# Patient Record
Sex: Female | Born: 1940 | Race: Black or African American | Hispanic: No | Smoking: Current every day smoker
Health system: Southern US, Community
[De-identification: ages and names within clinical notes are randomized; demographics above are authoritative.]

## PROBLEM LIST (undated history)

## (undated) DIAGNOSIS — K219 Gastro-esophageal reflux disease without esophagitis: Secondary | ICD-10-CM

## (undated) DIAGNOSIS — G459 Transient cerebral ischemic attack, unspecified: Secondary | ICD-10-CM

## (undated) DIAGNOSIS — I509 Heart failure, unspecified: Secondary | ICD-10-CM

## (undated) DIAGNOSIS — M1990 Unspecified osteoarthritis, unspecified site: Secondary | ICD-10-CM

## (undated) DIAGNOSIS — I10 Essential (primary) hypertension: Secondary | ICD-10-CM

## (undated) DIAGNOSIS — N289 Disorder of kidney and ureter, unspecified: Secondary | ICD-10-CM

## (undated) HISTORY — PX: ABDOMINAL HYSTERECTOMY: SHX81

---

## 2008-04-18 ENCOUNTER — Ambulatory Visit (HOSPITAL_BASED_OUTPATIENT_CLINIC_OR_DEPARTMENT_OTHER): Admission: RE | Admit: 2008-04-18 | Discharge: 2008-04-18 | Payer: Self-pay | Admitting: Internal Medicine

## 2008-04-18 ENCOUNTER — Ambulatory Visit: Payer: Self-pay | Admitting: Diagnostic Radiology

## 2014-02-24 ENCOUNTER — Encounter (HOSPITAL_BASED_OUTPATIENT_CLINIC_OR_DEPARTMENT_OTHER): Payer: Self-pay | Admitting: *Deleted

## 2014-02-24 ENCOUNTER — Emergency Department (HOSPITAL_BASED_OUTPATIENT_CLINIC_OR_DEPARTMENT_OTHER)
Admission: EM | Admit: 2014-02-24 | Discharge: 2014-02-24 | Disposition: A | Discharge status: Home / Self Care | Payer: Medicare Other | Attending: Emergency Medicine | Admitting: Emergency Medicine

## 2014-02-24 ENCOUNTER — Emergency Department (HOSPITAL_BASED_OUTPATIENT_CLINIC_OR_DEPARTMENT_OTHER): Payer: Medicare Other

## 2014-02-24 DIAGNOSIS — K219 Gastro-esophageal reflux disease without esophagitis: Secondary | ICD-10-CM | POA: Insufficient documentation

## 2014-02-24 DIAGNOSIS — R531 Weakness: Secondary | ICD-10-CM | POA: Diagnosis present

## 2014-02-24 DIAGNOSIS — M1990 Unspecified osteoarthritis, unspecified site: Secondary | ICD-10-CM | POA: Diagnosis not present

## 2014-02-24 DIAGNOSIS — Z79899 Other long term (current) drug therapy: Secondary | ICD-10-CM | POA: Diagnosis not present

## 2014-02-24 DIAGNOSIS — I509 Heart failure, unspecified: Secondary | ICD-10-CM | POA: Insufficient documentation

## 2014-02-24 DIAGNOSIS — R42 Dizziness and giddiness: Secondary | ICD-10-CM | POA: Diagnosis not present

## 2014-02-24 DIAGNOSIS — I10 Essential (primary) hypertension: Secondary | ICD-10-CM | POA: Diagnosis not present

## 2014-02-24 HISTORY — DX: Gastro-esophageal reflux disease without esophagitis: K21.9

## 2014-02-24 HISTORY — DX: Essential (primary) hypertension: I10

## 2014-02-24 HISTORY — DX: Heart failure, unspecified: I50.9

## 2014-02-24 HISTORY — DX: Unspecified osteoarthritis, unspecified site: M19.90

## 2014-02-24 LAB — CBC WITH DIFFERENTIAL/PLATELET
BASOS PCT: 1 % (ref 0–1)
Basophils Absolute: 0.1 10*3/uL (ref 0.0–0.1)
EOS ABS: 0.4 10*3/uL (ref 0.0–0.7)
Eosinophils Relative: 6 % — ABNORMAL HIGH (ref 0–5)
HCT: 35.6 % — ABNORMAL LOW (ref 36.0–46.0)
Hemoglobin: 11.5 g/dL — ABNORMAL LOW (ref 12.0–15.0)
LYMPHS ABS: 2 10*3/uL (ref 0.7–4.0)
Lymphocytes Relative: 27 % (ref 12–46)
MCH: 28.8 pg (ref 26.0–34.0)
MCHC: 32.3 g/dL (ref 30.0–36.0)
MCV: 89.2 fL (ref 78.0–100.0)
Monocytes Absolute: 1 10*3/uL (ref 0.1–1.0)
Monocytes Relative: 13 % — ABNORMAL HIGH (ref 3–12)
NEUTROS PCT: 53 % (ref 43–77)
Neutro Abs: 4.1 10*3/uL (ref 1.7–7.7)
PLATELETS: 264 10*3/uL (ref 150–400)
RBC: 3.99 MIL/uL (ref 3.87–5.11)
RDW: 13.1 % (ref 11.5–15.5)
WBC: 7.6 10*3/uL (ref 4.0–10.5)

## 2014-02-24 LAB — COMPREHENSIVE METABOLIC PANEL
ALT: 13 U/L (ref 0–35)
ANION GAP: 7 (ref 5–15)
AST: 18 U/L (ref 0–37)
Albumin: 3.7 g/dL (ref 3.5–5.2)
Alkaline Phosphatase: 126 U/L — ABNORMAL HIGH (ref 39–117)
BILIRUBIN TOTAL: 0.2 mg/dL — AB (ref 0.3–1.2)
BUN: 27 mg/dL — AB (ref 6–23)
CALCIUM: 9.4 mg/dL (ref 8.4–10.5)
CO2: 26 mmol/L (ref 19–32)
CREATININE: 1.36 mg/dL — AB (ref 0.50–1.10)
Chloride: 105 mEq/L (ref 96–112)
GFR, EST AFRICAN AMERICAN: 43 mL/min — AB (ref >90)
GFR, EST NON AFRICAN AMERICAN: 37 mL/min — AB (ref >90)
Glucose, Bld: 102 mg/dL — ABNORMAL HIGH (ref 70–99)
POTASSIUM: 4.5 mmol/L (ref 3.5–5.1)
SODIUM: 138 mmol/L (ref 135–145)
Total Protein: 7.2 g/dL (ref 6.0–8.3)

## 2014-02-24 LAB — BRAIN NATRIURETIC PEPTIDE: B NATRIURETIC PEPTIDE 5: 53.2 pg/mL (ref 0.0–100.0)

## 2014-02-24 LAB — URINALYSIS, ROUTINE W REFLEX MICROSCOPIC
BILIRUBIN URINE: NEGATIVE
Glucose, UA: NEGATIVE mg/dL
HGB URINE DIPSTICK: NEGATIVE
KETONES UR: NEGATIVE mg/dL
Leukocytes, UA: NEGATIVE
NITRITE: NEGATIVE
Protein, ur: NEGATIVE mg/dL
SPECIFIC GRAVITY, URINE: 1.019 (ref 1.005–1.030)
UROBILINOGEN UA: 1 mg/dL (ref 0.0–1.0)
pH: 7 (ref 5.0–8.0)

## 2014-02-24 LAB — TROPONIN I

## 2014-02-24 MED ORDER — SODIUM CHLORIDE 0.9 % IV BOLUS (SEPSIS): 500.0000 mL | Freq: Once | INTRAVENOUS | Status: DC

## 2014-02-24 NOTE — ED Provider Notes (Signed)
CSN: [id number]     Arrival date & time 02/24/14  1239 History   First MD Initiated Contact with Patient 02/24/14 1258     Chief Complaint  Patient presents with  . Weakness     (Consider location/radiation/quality/duration/timing/severity/associated sxs/prior Treatment) The history is provided by the patient.  Angela Chuauline S Bundrick is a 73 y.o. female hx of CHF, HTN, GERD here with diffuse weakness, lightheadedness. She felt lightheaded dizzy today. Like she was going to pass out. She has chronic shortness of breath from her CHF and is only on Cozaar and spironolactone. She notes that over the last 3 days she came in about 8 pounds. She states that leg swelling has been stable. Denies any fevers or chills and has been eating well. Her hands occasionally goes numb but denies any weakness or any trouble speaking. Denies any history of strokes. Denies any chest pain.    Past Medical History  Diagnosis Date  . CHF (congestive heart failure)   . Hypertension   . Arthritis   . GERD (gastroesophageal reflux disease)    History reviewed. No pertinent past surgical history. No family history on file. History  Substance Use Topics  . Smoking status: Never Smoker   . Smokeless tobacco: Not on file  . Alcohol Use: Not on file   OB History    No data available     Review of Systems  Neurological: Positive for dizziness.  All other systems reviewed and are negative.     Allergies  Aspirin and Lisinopril  Home Medications   Prior to Admission medications   Medication Sig Start Date End Date Taking? Authorizing Provider  calcium-vitamin D (OSCAL WITH D) 500-200 MG-UNIT per tablet Take 1 tablet by mouth.   Yes Historical Provider, MD  carvedilol (COREG) 25 MG tablet Take 25 mg by mouth 2 (two) times daily with a meal.   Yes Historical Provider, MD  losartan (COZAAR) 100 MG tablet Take 100 mg by mouth daily.   Yes Historical Provider, MD  Multiple Vitamins-Minerals (MULTIVITAMIN WITH  MINERALS) tablet Take 1 tablet by mouth daily.   Yes Historical Provider, MD  omeprazole (PRILOSEC) 20 MG capsule Take 20 mg by mouth daily.   Yes Historical Provider, MD  spironolactone (ALDACTONE) 25 MG tablet Take 25 mg by mouth daily.   Yes Historical Provider, MD  traMADol (ULTRAM) 50 MG tablet Take by mouth every 6 (six) hours as needed.   Yes Historical Provider, MD   BP 116/66 mmHg  Pulse 57  Temp(Src) 98.2 F (36.8 C) (Oral)  Resp 24  Ht 5\' 3"  (1.6 m)  Wt 201 lb (91.173 kg)  BMI 35.61 kg/m2  SpO2 96% Physical Exam  Constitutional:  Chronically ill, but well appearing   HENT:  Head: Normocephalic and atraumatic.  Mouth/Throat: Oropharynx is clear and moist.  Eyes: Conjunctivae and EOM are normal. Pupils are equal, round, and reactive to light.  No nystagmus   Neck: Normal range of motion. Neck supple.  Cardiovascular: Normal rate, regular rhythm and normal heart sounds.   Pulmonary/Chest: Effort normal.  Mild bibasilar crackles.   Abdominal: Soft. Bowel sounds are normal. She exhibits no distension. There is no tenderness. There is no rebound.  Musculoskeletal: Normal range of motion.  1+ edema bilaterally (chronic)   Neurological: She is alert.  CN 2-12 intact. Nl strength and sensation throughout.   Skin: Skin is warm and dry.  Psychiatric: She has a normal mood and affect. Her behavior is normal. Judgment and  thought content normal.  Nursing note and vitals reviewed.   ED Course  Procedures (including critical care time) Labs Review Labs Reviewed  CBC WITH DIFFERENTIAL - Abnormal; Notable for the following:    Hemoglobin 11.5 (*)    HCT 35.6 (*)    Monocytes Relative 13 (*)    Eosinophils Relative 6 (*)    All other components within normal limits  COMPREHENSIVE METABOLIC PANEL - Abnormal; Notable for the following:    Glucose, Bld 102 (*)    BUN 27 (*)    Creatinine, Ser 1.36 (*)    Alkaline Phosphatase 126 (*)    Total Bilirubin 0.2 (*)    GFR calc non  Af Amer 37 (*)    GFR calc Af Amer 43 (*)    All other components within normal limits  URINALYSIS, ROUTINE W REFLEX MICROSCOPIC - Abnormal; Notable for the following:    APPearance CLOUDY (*)    All other components within normal limits  TROPONIN I  BRAIN NATRIURETIC PEPTIDE    Imaging Review Dg Chest 2 View  02/24/2014   CLINICAL DATA:  Weakness for 3-4 days.  EXAM: CHEST  2 VIEW  COMPARISON:  CT chest 04/18/2008.  FINDINGS: The lungs are emphysematous but clear. Heart size is upper normal. No pneumothorax or pleural effusion. Marked convex left thoracolumbar scoliosis is noted.  IMPRESSION: Emphysema without acute disease.   Electronically Signed   By: Drusilla Kannerhomas  Dalessio M.D.   On: 02/24/2014 14:02     EKG Interpretation   Date/Time:  Friday February 24 2014 13:07:13 EST Ventricular Rate:  54 PR Interval:  168 QRS Duration: 78 QT Interval:  420 QTC Calculation: 398 R Axis:   -10 Text Interpretation:  Sinus bradycardia Low voltage QRS Borderline ECG No  previous ECGs available Confirmed by Limmie Schoenberg  MD, Ashani Pumphrey ([id number]) on 02/24/2014  2:52:02 PM      MDM   Final diagnoses:  Weakness    Angela Mueller is a 73 y.o. female here with lightheadedness, weight gain, SOB. Consider CHF exacerbation vs infection. Less likely to be ACS. Will get labs, BNP, CXR, UA.   3:13 PM CXR unremarkable. Labs unremarkable. BNP nl. UA nl. Not orthostatic. I don't know why she gained weight or why she was dizzy. She now says that she missed a meal today. Likely mild dehydration. Stable for d/c.     Richardean Canalavid H Josiane Labine, MD 02/24/14 [phone]

## 2014-02-24 NOTE — ED Notes (Signed)
MD at bedside. 

## 2014-02-24 NOTE — ED Notes (Signed)
C/o weakness and hx of chf and states she has gained 8 lbs over last 3 days. Hands going numb on and off. C/o dizziness.

## 2014-02-24 NOTE — Discharge Instructions (Signed)
Continue taking your medicines.   Follow up with your doctor.   Return to ER if you have worse shortness of breath, passing out, dizziness, weakness.

## 2020-04-03 ENCOUNTER — Emergency Department (HOSPITAL_BASED_OUTPATIENT_CLINIC_OR_DEPARTMENT_OTHER)
Admission: EM | Admit: 2020-04-03 | Discharge: 2020-04-03 | Disposition: A | Discharge status: Home / Self Care | Payer: Medicare Other | Attending: Emergency Medicine | Admitting: Emergency Medicine

## 2020-04-03 ENCOUNTER — Encounter (HOSPITAL_BASED_OUTPATIENT_CLINIC_OR_DEPARTMENT_OTHER): Payer: Self-pay | Admitting: *Deleted

## 2020-04-03 ENCOUNTER — Other Ambulatory Visit: Payer: Self-pay

## 2020-04-03 DIAGNOSIS — Z79899 Other long term (current) drug therapy: Secondary | ICD-10-CM | POA: Insufficient documentation

## 2020-04-03 DIAGNOSIS — R42 Dizziness and giddiness: Secondary | ICD-10-CM | POA: Diagnosis present

## 2020-04-03 DIAGNOSIS — R251 Tremor, unspecified: Secondary | ICD-10-CM | POA: Diagnosis not present

## 2020-04-03 DIAGNOSIS — I509 Heart failure, unspecified: Secondary | ICD-10-CM | POA: Insufficient documentation

## 2020-04-03 DIAGNOSIS — I110 Hypertensive heart disease with heart failure: Secondary | ICD-10-CM | POA: Diagnosis not present

## 2020-04-03 HISTORY — DX: Transient cerebral ischemic attack, unspecified: G45.9

## 2020-04-03 HISTORY — DX: Disorder of kidney and ureter, unspecified: N28.9

## 2020-04-03 LAB — CBC
HCT: 39 % (ref 36.0–46.0)
Hemoglobin: 12.6 g/dL (ref 12.0–15.0)
MCH: 27.5 pg (ref 26.0–34.0)
MCHC: 32.3 g/dL (ref 30.0–36.0)
MCV: 85 fL (ref 80.0–100.0)
Platelets: 265 10*3/uL (ref 150–400)
RBC: 4.59 MIL/uL (ref 3.87–5.11)
RDW: 14.7 % (ref 11.5–15.5)
WBC: 6.9 10*3/uL (ref 4.0–10.5)
nRBC: 0 % (ref 0.0–0.2)

## 2020-04-03 LAB — URINALYSIS, ROUTINE W REFLEX MICROSCOPIC
Bilirubin Urine: NEGATIVE
Glucose, UA: NEGATIVE mg/dL
Hgb urine dipstick: NEGATIVE
Ketones, ur: NEGATIVE mg/dL
Leukocytes,Ua: NEGATIVE
Nitrite: NEGATIVE
Protein, ur: NEGATIVE mg/dL
Specific Gravity, Urine: 1.01 (ref 1.005–1.030)
pH: 7.5 (ref 5.0–8.0)

## 2020-04-03 LAB — BASIC METABOLIC PANEL
Anion gap: 8 (ref 5–15)
BUN: 15 mg/dL (ref 8–23)
CO2: 25 mmol/L (ref 22–32)
Calcium: 9.1 mg/dL (ref 8.9–10.3)
Chloride: 104 mmol/L (ref 98–111)
Creatinine, Ser: 0.84 mg/dL (ref 0.44–1.00)
GFR, Estimated: >60 mL/min (ref >60)
Glucose, Bld: 94 mg/dL (ref 70–99)
Potassium: 3.8 mmol/L (ref 3.5–5.1)
Sodium: 137 mmol/L (ref 135–145)

## 2020-04-03 NOTE — ED Provider Notes (Signed)
[facility] Note: Angela Dell, MD, FACEP  CSN: [id number] MRN: [id number] ARRIVAL: 04/03/20 at 0326 ROOM: MH03/MH03   CHIEF COMPLAINT  Dizziness   HISTORY OF PRESENT ILLNESS  04/03/20 6:18 AM Angela Mueller is a 80 y.o. female with dizziness and shaking all over that began about 8 PM yesterday evening.  She describes the dizziness as a sensation of the room is spinning.  It lasted about 20 minutes.  She cannot say if moving her head made it any worse or better.  It was followed by some transient difficulty speaking.  It was then followed a feeling of anxiety and generalized tremors.  Apart from the tremor her symptoms have resolved.  She was recently seen at [facility] regional ED for similar symptoms (March 15, 2020).  CT head was unremarkable.  She has had other visits for similar TIA symptoms with negative work-ups including MRI.  CT angiography of the head neck revealed no large vessel occlusion or high-grade arterial stenosis.  She states she is scheduled for additional outpatient work-up.  She is on aspirin and Plavix.   Past Medical History:  Diagnosis Date  . Arthritis   . CHF (congestive heart failure) (HCC)   . GERD (gastroesophageal reflux disease)   . Hypertension   . Renal disorder   . TIA (transient ischemic attack)     History reviewed. No pertinent surgical history.  No family history on file.  Social History   Tobacco Use  . Smoking status: Never Smoker    Prior to Admission medications   Medication Sig Start Date End Date Taking? Authorizing Cailie Bosshart  amLODipine (NORVASC) 10 MG tablet Take 10 mg by mouth daily. 03/08/20   Rogan Wigley, Historical, MD  aspirin 81 MG chewable tablet Chew 81 mg by mouth daily. 03/26/20   Clif Serio, Historical, MD  atorvastatin (LIPITOR) 10 MG tablet Take 10 mg by mouth daily. 03/08/20   Tkeyah Burkman, Historical, MD  atorvastatin (LIPITOR) 40 MG tablet Take 40 mg by mouth at bedtime. 02/17/20   Tianni Escamilla,  Historical, MD  calcium-vitamin D (OSCAL WITH D) 500-200 MG-UNIT per tablet Take 1 tablet by mouth.    Geralyn Figiel, Historical, MD  carvedilol (COREG) 25 MG tablet Take 25 mg by mouth 2 (two) times daily with a meal.    Tarin Johndrow, Historical, MD  clopidogrel (PLAVIX) 75 MG tablet Take 75 mg by mouth daily. 03/13/20   Maccoy Haubner, Historical, MD  losartan (COZAAR) 100 MG tablet Take 100 mg by mouth daily.    Izetta Sakamoto, Historical, MD  Multiple Vitamins-Minerals (MULTIVITAMIN WITH MINERALS) tablet Take 1 tablet by mouth daily.    Saylor Sheckler, Historical, MD  omeprazole (PRILOSEC) 20 MG capsule Take 20 mg by mouth daily.    Anahita Cua, Historical, MD  spironolactone (ALDACTONE) 25 MG tablet Take 25 mg by mouth daily.    Christen Wardrop, Historical, MD  traMADol (ULTRAM) 50 MG tablet Take by mouth every 6 (six) hours as needed.    Julaine Zimny, Historical, MD    Allergies Aspirin and Lisinopril   REVIEW OF SYSTEMS  Negative except as noted here or in the History of Present Illness.   PHYSICAL EXAMINATION  Initial Vital Signs Blood pressure (!) 152/62, pulse 60, temperature 98.2 F (36.8 C), resp. rate 17, SpO2 100 %.  Examination General: Well-developed, well-nourished female in no acute distress; appearance consistent with age of record HENT: normocephalic; atraumatic Eyes: pupils equal, round and reactive to light; extraocular muscles intact; bilateral pseudophakia Neck: supple; no bruit Heart: regular rate  and rhythm Lungs: clear to auscultation bilaterally Abdomen: soft; nondistended; nontender; bowel sounds present Extremities: No deformity; full range of motion; pulses normal Neurologic: Awake, alert and oriented; motor function intact in all extremities and symmetric; no facial droop; normal coordination and speech; mild hand tremor bilaterally Skin: Warm and dry Psychiatric: Normal mood and affect   RESULTS  Summary of this visit's results, reviewed and interpreted by myself:   EKG  Interpretation  Date/Time:  Tuesday April 03 2020 03:33:59 EST Ventricular Rate:  77 PR Interval:  164 QRS Duration: 78 QT Interval:  392 QTC Calculation: 443 R Axis:   -8 Text Interpretation: Normal sinus rhythm with sinus arrhythmia Possible Anterior infarct , age undetermined Abnormal ECG Artifact Confirmed by Paula Libra ([id number]) on 04/03/2020 3:46:58 AM      Laboratory Studies: Results for orders placed or performed during the hospital encounter of 04/03/20 (from the past 24 hour(s))  Urinalysis, Routine w reflex microscopic Urine, Clean Catch     Status: Abnormal   Collection Time: 04/03/20  4:03 AM  Result Value Ref Range   Color, Urine YELLOW YELLOW   APPearance HAZY (A) CLEAR   Specific Gravity, Urine 1.010 1.005 - 1.030   pH 7.5 5.0 - 8.0   Glucose, UA NEGATIVE NEGATIVE mg/dL   Hgb urine dipstick NEGATIVE NEGATIVE   Bilirubin Urine NEGATIVE NEGATIVE   Ketones, ur NEGATIVE NEGATIVE mg/dL   Protein, ur NEGATIVE NEGATIVE mg/dL   Nitrite NEGATIVE NEGATIVE   Leukocytes,Ua NEGATIVE NEGATIVE  Basic metabolic panel     Status: None   Collection Time: 04/03/20  4:03 AM  Result Value Ref Range   Sodium 137 135 - 145 mmol/L   Potassium 3.8 3.5 - 5.1 mmol/L   Chloride 104 98 - 111 mmol/L   CO2 25 22 - 32 mmol/L   Glucose, Bld 94 70 - 99 mg/dL   BUN 15 8 - 23 mg/dL   Creatinine, Ser 2.63 0.44 - 1.00 mg/dL   Calcium 9.1 8.9 - 33.5 mg/dL   GFR, Estimated >45 >62 mL/min   Anion gap 8 5 - 15  CBC     Status: None   Collection Time: 04/03/20  4:03 AM  Result Value Ref Range   WBC 6.9 4.0 - 10.5 K/uL   RBC 4.59 3.87 - 5.11 MIL/uL   Hemoglobin 12.6 12.0 - 15.0 g/dL   HCT 56.3 89.3 - 73.4 %   MCV 85.0 80.0 - 100.0 fL   MCH 27.5 26.0 - 34.0 pg   MCHC 32.3 30.0 - 36.0 g/dL   RDW 28.7 68.1 - 15.7 %   Platelets 265 150 - 400 K/uL   nRBC 0.0 0.0 - 0.2 %   Imaging Studies: No results found.  ED COURSE and MDM  Nursing notes, initial and subsequent vitals signs, including  pulse oximetry, reviewed and interpreted by myself.  Vitals:   04/03/20 0334 04/03/20 0545  BP: (!) 158/68 (!) 152/62  Pulse: 80 60  Resp: 16 17  Temp: 98.2 F (36.8 C)   SpO2: 96% 100%   Medications - No data to display  The patient's symptoms are similar to TIAs she has had in the past and for which she has had multiple work-ups.  She is on Plavix and aspirin.  Based on the recent work-up done at [facility] regional I do not believe additional work-up is indicated at this time.  Her symptoms were brief and resolved on their own.  The patient states she feels almost  back to normal and would like to go home.  PROCEDURES  Procedures   ED DIAGNOSES     ICD-10-CM   1. Dizziness  R42        , , MD 04/03/20 0630

## 2020-04-03 NOTE — ED Triage Notes (Signed)
Pt arrives with c/o dizziness tonight- started approx around 8, and feeling shaky all over. Pt reports recently in [facility] ED for similar symptoms. Speech is clear, mae x4, alert and oriented.

## 2020-04-06 ENCOUNTER — Encounter (HOSPITAL_BASED_OUTPATIENT_CLINIC_OR_DEPARTMENT_OTHER): Payer: Self-pay | Admitting: Emergency Medicine

## 2020-04-06 ENCOUNTER — Emergency Department (HOSPITAL_BASED_OUTPATIENT_CLINIC_OR_DEPARTMENT_OTHER)
Admission: EM | Admit: 2020-04-06 | Discharge: 2020-04-06 | Disposition: A | Discharge status: Home / Self Care | Payer: Medicare Other | Attending: Emergency Medicine | Admitting: Emergency Medicine

## 2020-04-06 ENCOUNTER — Emergency Department (HOSPITAL_BASED_OUTPATIENT_CLINIC_OR_DEPARTMENT_OTHER): Payer: Medicare Other

## 2020-04-06 ENCOUNTER — Other Ambulatory Visit: Payer: Self-pay

## 2020-04-06 DIAGNOSIS — R42 Dizziness and giddiness: Secondary | ICD-10-CM | POA: Insufficient documentation

## 2020-04-06 DIAGNOSIS — R479 Unspecified speech disturbances: Secondary | ICD-10-CM | POA: Insufficient documentation

## 2020-04-06 DIAGNOSIS — I509 Heart failure, unspecified: Secondary | ICD-10-CM | POA: Diagnosis not present

## 2020-04-06 LAB — COMPREHENSIVE METABOLIC PANEL
ALT: 11 U/L (ref 0–44)
AST: 18 U/L (ref 15–41)
Albumin: 3.5 g/dL (ref 3.5–5.0)
Alkaline Phosphatase: 110 U/L (ref 38–126)
Anion gap: 11 (ref 5–15)
BUN: 13 mg/dL (ref 8–23)
CO2: 24 mmol/L (ref 22–32)
Calcium: 9.2 mg/dL (ref 8.9–10.3)
Chloride: 101 mmol/L (ref 98–111)
Creatinine, Ser: 0.93 mg/dL (ref 0.44–1.00)
GFR, Estimated: >60 mL/min (ref >60)
Glucose, Bld: 89 mg/dL (ref 70–99)
Potassium: 4 mmol/L (ref 3.5–5.1)
Sodium: 136 mmol/L (ref 135–145)
Total Bilirubin: 0.3 mg/dL (ref 0.3–1.2)
Total Protein: 7.3 g/dL (ref 6.5–8.1)

## 2020-04-06 LAB — CBC WITH DIFFERENTIAL/PLATELET
Abs Immature Granulocytes: 0.01 10*3/uL (ref 0.00–0.07)
Basophils Absolute: 0.1 10*3/uL (ref 0.0–0.1)
Basophils Relative: 2 %
Eosinophils Absolute: 0.2 10*3/uL (ref 0.0–0.5)
Eosinophils Relative: 4 %
HCT: 38.1 % (ref 36.0–46.0)
Hemoglobin: 12 g/dL (ref 12.0–15.0)
Immature Granulocytes: 0 %
Lymphocytes Relative: 36 %
Lymphs Abs: 2.1 10*3/uL (ref 0.7–4.0)
MCH: 26.5 pg (ref 26.0–34.0)
MCHC: 31.5 g/dL (ref 30.0–36.0)
MCV: 84.3 fL (ref 80.0–100.0)
Monocytes Absolute: 0.8 10*3/uL (ref 0.1–1.0)
Monocytes Relative: 14 %
Neutro Abs: 2.7 10*3/uL (ref 1.7–7.7)
Neutrophils Relative %: 44 %
Platelets: 248 10*3/uL (ref 150–400)
RBC: 4.52 MIL/uL (ref 3.87–5.11)
RDW: 14.5 % (ref 11.5–15.5)
WBC: 6 10*3/uL (ref 4.0–10.5)
nRBC: 0 % (ref 0.0–0.2)

## 2020-04-06 LAB — TROPONIN I (HIGH SENSITIVITY): Troponin I (High Sensitivity): 6 ng/L (ref <18)

## 2020-04-06 NOTE — Discharge Instructions (Signed)
You were seen in the emergency department today with return of your disease symptoms.  I spoke with your neurology team at [facility] and they have scheduled an EEG on Monday along with virtual visit.  You will need to reschedule your MRI and keep your appointment with the neurosurgery team.  Please return to the emergency department any new or suddenly worsening symptoms but keep these outpatient follow-up appointments to help determine the cause of your symptoms.

## 2020-04-06 NOTE — ED Triage Notes (Signed)
Reports having dizziness and feeling shaky for the last two weeks.  Seen a few days for the same.  Reports having a heart monitor placed yesterday.  Scheduled for MRI on the 16th.

## 2020-04-06 NOTE — ED Notes (Signed)
Denies any dizziness, happy to be going home, daughter with her

## 2020-04-06 NOTE — ED Provider Notes (Signed)
Emergency Department Provider Note   I have reviewed the triage vital signs and the nursing notes.   HISTORY  Chief Complaint Dizziness   HPI Angela Mueller is a 80 y.o. female with past medical history reviewed below including multiple ED presentations with intermittent dizziness and speech disturbance.  She is primarily followed at the [facility] med [facility] with [facility].  She has had MRI as recently as mid December with no acute findings.  She continues to have these episodes and has been scheduled for outpatient EEG.  She has known MCA aneurysm but has been a recent no-show at her neurosurgery appointment.  She tells me she had a study scheduled yesterday but was called at home to cancel the appointment and this has not been rescheduled.  This morning, she again had an episode where she felt dizzy/lightheaded without vertigo.  She had some speech disturbance that lasted for several minutes and then resolved.  She called her daughter to drive her to the emergency department and arrived here.  She is wearing a heart monitor which was placed recently with similar symptoms.  Denies any chest pain or shortness of breath.  No unilateral weakness or numbness.  No vision changes.  No active symptoms at this time.   Past Medical History:  Diagnosis Date  . Arthritis   . CHF (congestive heart failure) (HCC)   . GERD (gastroesophageal reflux disease)   . Hypertension   . Renal disorder   . TIA (transient ischemic attack)     There are no problems to display for this patient.   History reviewed. No pertinent surgical history.  Allergies Aspirin and Lisinopril  No family history on file.  Social History Social History   Tobacco Use  . Smoking status: Never Smoker  . Smokeless tobacco: Never Used  Substance Use Topics  . Alcohol use: Never  . Drug use: Never    Review of Systems  Constitutional: No fever/chills Eyes: No visual changes. ENT: No sore  throat. Cardiovascular: Denies chest pain. Respiratory: Denies shortness of breath. Gastrointestinal: No abdominal pain.  No nausea, no vomiting.  No diarrhea.  No constipation. Genitourinary: Negative for dysuria. Musculoskeletal: Negative for back pain. Skin: Negative for rash. Neurological: Negative for headaches, focal weakness or numbness. Positive dizziness and speech disturbance.   10-point ROS otherwise negative.  ____________________________________________   PHYSICAL EXAM:  VITAL SIGNS: ED Triage Vitals  Enc Vitals Group     BP 04/06/20 1133 (!) 172/58     Pulse Rate 04/06/20 1133 65     Resp 04/06/20 1133 18     Temp 04/06/20 1133 98.4 F (36.9 C)     Temp Source 04/06/20 1133 Oral     SpO2 04/06/20 1133 100 %     Weight 04/06/20 1134 137 lb (62.1 kg)     Height 04/06/20 1134 5\' 3"  (1.6 m)   Constitutional: Alert and oriented. Well appearing and in no acute distress. Eyes: Conjunctivae are normal. PERRL. EOMI. Head: Atraumatic. Ears:  Healthy appearing ear canals and TMs bilaterally Nose: No congestion/rhinnorhea. Mouth/Throat: Mucous membranes are moist.  Neck: No stridor.  Cardiovascular: Normal rate, regular rhythm. Good peripheral circulation. Grossly normal heart sounds.   Respiratory: Normal respiratory effort.  No retractions. Lungs CTAB. Gastrointestinal: Soft and nontender. No distention.  Musculoskeletal: No lower extremity tenderness nor edema. No gross deformities of extremities. Neurologic:  Normal speech and language.  No facial asymmetry.  Equal sensation in the bilateral upper and lower extremities.  Normal finger-to-nose and heel-to-shin test.  No drift.  Skin:  Skin is warm, dry and intact. No rash noted.   ____________________________________________   LABS (all labs ordered are listed, but only abnormal results are displayed)  Labs Reviewed  COMPREHENSIVE METABOLIC PANEL  CBC WITH DIFFERENTIAL/PLATELET  TROPONIN I (HIGH SENSITIVITY)   TROPONIN I (HIGH SENSITIVITY)   ____________________________________________  EKG   EKG Interpretation  Date/Time:  Friday April 06 2020 12:27:22 EST Ventricular Rate:  58 PR Interval:    QRS Duration: 92 QT Interval:  445 QTC Calculation: 438 R Axis:   -9 Text Interpretation: Sinus rhythm Anteroseptal infarct, old No STEMI Confirmed by Alona Bene [phone]) on 04/06/2020 12:30:48 PM       ____________________________________________  RADIOLOGY  CT Head Wo Contrast  Result Date: 04/06/2020 CLINICAL DATA:  Dizziness. EXAM: CT HEAD WITHOUT CONTRAST TECHNIQUE: Contiguous axial images were obtained from the base of the skull through the vertex without intravenous contrast. COMPARISON:  03/15/2020 FINDINGS: Brain: There is no evidence for acute hemorrhage, hydrocephalus, mass lesion, or abnormal extra-axial fluid collection. No definite CT evidence for acute infarction. Diffuse loss of parenchymal volume is consistent with atrophy. Patchy low attenuation in the deep hemispheric and periventricular white matter is nonspecific, but likely reflects chronic microvascular ischemic demyelination. Vascular: No hyperdense vessel or unexpected calcification. Skull: No evidence for fracture. No worrisome lytic or sclerotic lesion. Sinuses/Orbits: The visualized paranasal sinuses and mastoid air cells are clear. Visualized portions of the globes and intraorbital fat are unremarkable. Other: None. IMPRESSION: 1. Stable.  No acute intracranial abnormality. 2. Atrophy with chronic small vessel white matter ischemic disease. Electronically Signed   By: Kennith Center M.D.   On: 04/06/2020 13:13    ____________________________________________   PROCEDURES  Procedure(s) performed:   Procedures  None  ____________________________________________   INITIAL IMPRESSION / ASSESSMENT AND PLAN / ED COURSE  Pertinent labs & imaging results that were available during my care of the patient were reviewed  by me and considered in my medical decision making (see chart for details).   Patient returns to the emergency department with dizziness and speech disturbance.  I reviewed the care everywhere notes from her most recent neurology evaluation.  She has been set up for repeat MRI, EEG, neurosurgery consult as an outpatient but I do not see that these have been completed as of yet.  Patient has no active symptoms whereby I would activate a code stroke.  Plan to obtain CT imaging of the head along with labs and touch base with her neurologist at [facility].  Spoke with the patient's neurologist at [facility], [place], [place].  She is able to review the chart with me by phone.  The patient has EEG scheduled for Monday along with virtual visit.  There are future appointments with neurology and neurosurgery.  Outpatient MRI ordered.  Discussed the patient's symptoms which are recurrent and not new or different today.  CT imaging of the head and blood work with no acute findings.  The neurology team will follow with her as an outpatient with studies which were ordered for next week.  Discussed this with the patient and her family member at bedside who is helping coordinate these outpatient visits.  Discussed ED return precautions in detail.  Family and patient are comfortable with discharge at this time.  ____________________________________________  FINAL CLINICAL IMPRESSION(S) / ED DIAGNOSES  Final diagnoses:  Dizziness    Note:  This document was prepared using Dragon voice recognition software and  may include unintentional dictation errors.  Alona Bene, MD, [facility]    Rosio Weiss, Arlyss Repress, MD 04/06/20 [phone]

## 2021-03-13 ENCOUNTER — Encounter (HOSPITAL_BASED_OUTPATIENT_CLINIC_OR_DEPARTMENT_OTHER): Payer: Self-pay | Admitting: *Deleted

## 2021-03-13 ENCOUNTER — Observation Stay (HOSPITAL_BASED_OUTPATIENT_CLINIC_OR_DEPARTMENT_OTHER)
Admission: EM | Admit: 2021-03-13 | Discharge: 2021-03-15 | Disposition: A | Discharge status: Home / Self Care | Payer: Medicare Other | Attending: Internal Medicine | Admitting: Internal Medicine

## 2021-03-13 ENCOUNTER — Other Ambulatory Visit: Payer: Self-pay

## 2021-03-13 ENCOUNTER — Emergency Department (HOSPITAL_BASED_OUTPATIENT_CLINIC_OR_DEPARTMENT_OTHER): Payer: Medicare Other

## 2021-03-13 DIAGNOSIS — Z79899 Other long term (current) drug therapy: Secondary | ICD-10-CM | POA: Diagnosis not present

## 2021-03-13 DIAGNOSIS — Z7982 Long term (current) use of aspirin: Secondary | ICD-10-CM | POA: Insufficient documentation

## 2021-03-13 DIAGNOSIS — F1290 Cannabis use, unspecified, uncomplicated: Secondary | ICD-10-CM | POA: Insufficient documentation

## 2021-03-13 DIAGNOSIS — R2689 Other abnormalities of gait and mobility: Secondary | ICD-10-CM | POA: Diagnosis not present

## 2021-03-13 DIAGNOSIS — E876 Hypokalemia: Secondary | ICD-10-CM | POA: Diagnosis present

## 2021-03-13 DIAGNOSIS — H409 Unspecified glaucoma: Secondary | ICD-10-CM | POA: Diagnosis not present

## 2021-03-13 DIAGNOSIS — I110 Hypertensive heart disease with heart failure: Secondary | ICD-10-CM | POA: Insufficient documentation

## 2021-03-13 DIAGNOSIS — R4182 Altered mental status, unspecified: Secondary | ICD-10-CM | POA: Diagnosis present

## 2021-03-13 DIAGNOSIS — F801 Expressive language disorder: Principal | ICD-10-CM | POA: Insufficient documentation

## 2021-03-13 DIAGNOSIS — I10 Essential (primary) hypertension: Secondary | ICD-10-CM | POA: Diagnosis present

## 2021-03-13 DIAGNOSIS — F03918 Unspecified dementia, unspecified severity, with other behavioral disturbance: Secondary | ICD-10-CM | POA: Diagnosis present

## 2021-03-13 DIAGNOSIS — Z8673 Personal history of transient ischemic attack (TIA), and cerebral infarction without residual deficits: Secondary | ICD-10-CM | POA: Diagnosis not present

## 2021-03-13 DIAGNOSIS — Z20822 Contact with and (suspected) exposure to covid-19: Secondary | ICD-10-CM | POA: Insufficient documentation

## 2021-03-13 DIAGNOSIS — R4701 Aphasia: Secondary | ICD-10-CM | POA: Diagnosis present

## 2021-03-13 DIAGNOSIS — I509 Heart failure, unspecified: Secondary | ICD-10-CM | POA: Diagnosis not present

## 2021-03-13 DIAGNOSIS — I671 Cerebral aneurysm, nonruptured: Secondary | ICD-10-CM | POA: Diagnosis present

## 2021-03-13 DIAGNOSIS — R42 Dizziness and giddiness: Secondary | ICD-10-CM

## 2021-03-13 LAB — APTT: aPTT: 28 seconds (ref 24–36)

## 2021-03-13 LAB — COMPREHENSIVE METABOLIC PANEL
ALT: 15 U/L (ref 0–44)
AST: 21 U/L (ref 15–41)
Albumin: 3.8 g/dL (ref 3.5–5.0)
Alkaline Phosphatase: 109 U/L (ref 38–126)
Anion gap: 9 (ref 5–15)
BUN: 13 mg/dL (ref 8–23)
CO2: 25 mmol/L (ref 22–32)
Calcium: 9.4 mg/dL (ref 8.9–10.3)
Chloride: 104 mmol/L (ref 98–111)
Creatinine, Ser: 0.85 mg/dL (ref 0.44–1.00)
GFR, Estimated: >60 mL/min (ref >60)
Glucose, Bld: 90 mg/dL (ref 70–99)
Potassium: 3.2 mmol/L — ABNORMAL LOW (ref 3.5–5.1)
Sodium: 138 mmol/L (ref 135–145)
Total Bilirubin: 0.5 mg/dL (ref 0.3–1.2)
Total Protein: 8.1 g/dL (ref 6.5–8.1)

## 2021-03-13 LAB — URINALYSIS, ROUTINE W REFLEX MICROSCOPIC
Bilirubin Urine: NEGATIVE
Glucose, UA: NEGATIVE mg/dL
Hgb urine dipstick: NEGATIVE
Ketones, ur: NEGATIVE mg/dL
Nitrite: NEGATIVE
Protein, ur: NEGATIVE mg/dL
Specific Gravity, Urine: 1.025 (ref 1.005–1.030)
pH: 5.5 (ref 5.0–8.0)

## 2021-03-13 LAB — RAPID URINE DRUG SCREEN, HOSP PERFORMED
Amphetamines: NOT DETECTED
Barbiturates: NOT DETECTED
Benzodiazepines: NOT DETECTED
Cocaine: NOT DETECTED
Opiates: NOT DETECTED
Tetrahydrocannabinol: POSITIVE — AB

## 2021-03-13 LAB — DIFFERENTIAL
Abs Immature Granulocytes: 0.04 10*3/uL (ref 0.00–0.07)
Basophils Absolute: 0.1 10*3/uL (ref 0.0–0.1)
Basophils Relative: 1 %
Eosinophils Absolute: 0.3 10*3/uL (ref 0.0–0.5)
Eosinophils Relative: 3 %
Immature Granulocytes: 1 %
Lymphocytes Relative: 27 %
Lymphs Abs: 2.2 10*3/uL (ref 0.7–4.0)
Monocytes Absolute: 1 10*3/uL (ref 0.1–1.0)
Monocytes Relative: 13 %
Neutro Abs: 4.5 10*3/uL (ref 1.7–7.7)
Neutrophils Relative %: 55 %

## 2021-03-13 LAB — URINALYSIS, MICROSCOPIC (REFLEX)

## 2021-03-13 LAB — RESP PANEL BY RT-PCR (FLU A&B, COVID) ARPGX2
Influenza A by PCR: NEGATIVE
Influenza B by PCR: NEGATIVE
SARS Coronavirus 2 by RT PCR: NEGATIVE

## 2021-03-13 LAB — CBC
HCT: 43.7 % (ref 36.0–46.0)
Hemoglobin: 14.2 g/dL (ref 12.0–15.0)
MCH: 29.6 pg (ref 26.0–34.0)
MCHC: 32.5 g/dL (ref 30.0–36.0)
MCV: 91.2 fL (ref 80.0–100.0)
Platelets: 259 10*3/uL (ref 150–400)
RBC: 4.79 MIL/uL (ref 3.87–5.11)
RDW: 13.7 % (ref 11.5–15.5)
WBC: 8.1 10*3/uL (ref 4.0–10.5)
nRBC: 0 % (ref 0.0–0.2)

## 2021-03-13 LAB — ETHANOL: Alcohol, Ethyl (B): <10 mg/dL (ref <10)

## 2021-03-13 LAB — PROTIME-INR
INR: 1 (ref 0.8–1.2)
Prothrombin Time: 12.9 seconds (ref 11.4–15.2)

## 2021-03-13 MED ORDER — CLOPIDOGREL BISULFATE 300 MG PO TABS
300.0000 mg | ORAL_TABLET | Freq: Once | ORAL | Status: AC
  Administered 2021-03-13: 300 mg via ORAL
  Filled 2021-03-13: qty 1

## 2021-03-13 MED ORDER — IOHEXOL 350 MG/ML SOLN
80.0000 mL | Freq: Once | INTRAVENOUS | Status: AC | PRN
  Administered 2021-03-13: 80 mL via INTRAVENOUS

## 2021-03-13 NOTE — ED Notes (Signed)
Pt provided with peanut butter crackers and lemon lime soda.

## 2021-03-13 NOTE — ED Triage Notes (Signed)
Sent here from PMD office for eval ? CVA x 2 days ago , daughter reports  witnessed sudden onset of dizziness, weakness stuttering speech x 2 days ago

## 2021-03-13 NOTE — Progress Notes (Addendum)
HOSPITAL MEDICINE ACCEPTANCE NOTE    81 year old female with past medical history of gastroesophageal reflux disease, hypertension, congestive heart failure, cerebral aneurysm without rupture presenting to med [facility] emergency department with a 2-day history of expressive aphasia.    CT angiogram of the head and neck was performed revealing an unchanged cerebral aneurysm without rupture and no obvious evidence of stroke.    ER provider discussed case with Dr. Thomasena Edis with neurology who recommends hospitalization for TIA/stroke work-up.   Plavix administered.  Bed request placed for [facility].  Marinda Elk  MD [facility]

## 2021-03-13 NOTE — ED Provider Notes (Signed)
[facility] EMERGENCY DEPARTMENT Provider Note   CSN: [id number] Arrival date & time: 03/13/21  1658     History  Chief Complaint  Patient presents with   Altered Mental Status    Angela SPORTSMAN is a 81 y.o. female history of cerebral aneurysm, previous TIA here presenting with trouble speaking.  Patient was out shopping yesterday and had onset of trouble speaking.  The episodes appears to be intermittent.  Per the daughter, patient seems to be unable to get her words out.  Patient has a history of TIAs in the past.  Patient also has history of cerebral aneurysm.  The history is provided by the patient.      Home Medications Prior to Admission medications   Medication Sig Start Date End Date Taking? Authorizing Provider  amLODipine (NORVASC) 10 MG tablet Take 10 mg by mouth daily. 03/08/20   [provider]  aspirin 81 MG chewable tablet Chew 81 mg by mouth daily. 03/26/20   [provider]  atorvastatin (LIPITOR) 10 MG tablet Take 10 mg by mouth daily. 03/08/20   [provider]  atorvastatin (LIPITOR) 40 MG tablet Take 40 mg by mouth at bedtime. 02/17/20   [provider]  calcium-vitamin D (OSCAL WITH D) 500-200 MG-UNIT per tablet Take 1 tablet by mouth.    [provider]  carvedilol (COREG) 25 MG tablet Take 25 mg by mouth 2 (two) times daily with a meal.    [provider]  clopidogrel (PLAVIX) 75 MG tablet Take 75 mg by mouth daily. 03/13/20   [provider]  losartan (COZAAR) 100 MG tablet Take 100 mg by mouth daily.    [provider]  Multiple Vitamins-Minerals (MULTIVITAMIN WITH MINERALS) tablet Take 1 tablet by mouth daily.    [provider]  omeprazole (PRILOSEC) 20 MG capsule Take 20 mg by mouth daily.    [provider]  spironolactone (ALDACTONE) 25 MG tablet Take 25 mg by mouth daily.    [provider]  traMADol (ULTRAM) 50 MG tablet Take by mouth every 6  (six) hours as needed.    [provider]      Allergies    Aspirin and Lisinopril    Review of Systems   Review of Systems  Neurological:  Positive for speech difficulty.  All other systems reviewed and are negative.  Physical Exam Updated Vital Signs BP 136/62 (BP Location: Left Arm)    Pulse (!) 55    Temp 98.3 F (36.8 C) (Oral)    Resp 18    Ht 5\' 3"  (1.6 m)    Wt 59.9 kg    SpO2 96%    BMI 23.38 kg/m  Physical Exam Vitals and nursing note reviewed.  Constitutional:      Comments: Chronically ill  HENT:     Head: Normocephalic.     Nose: Nose normal.     Mouth/Throat:     Mouth: Mucous membranes are moist.  Eyes:     Extraocular Movements: Extraocular movements intact.     Pupils: Pupils are equal, round, and reactive to light.  Cardiovascular:     Rate and Rhythm: Normal rate and regular rhythm.     Pulses: Normal pulses.     Heart sounds: Normal heart sounds.  Pulmonary:     Effort: Pulmonary effort is normal.     Breath sounds: Normal breath sounds.  Abdominal:     General: Abdomen is flat.     Palpations:  Abdomen is soft.  Musculoskeletal:        General: Normal range of motion.     Cervical back: Normal range of motion and neck supple.  Skin:    General: Skin is warm.     Capillary Refill: Capillary refill takes less than 2 seconds.  Neurological:     Comments: Patient has some intermittent expressive aphasia.  Patient has no obvious facial droop.  Normal strength and sensation bilateral arms and legs.  Patient able to ambulate by herself.  No visual field cut  Psychiatric:        Mood and Affect: Mood normal.        Behavior: Behavior normal.    ED Results / Procedures / Treatments   Labs (all labs ordered are listed, but only abnormal results are displayed) Labs Reviewed  COMPREHENSIVE METABOLIC PANEL - Abnormal; Notable for the following components:      Result Value   Potassium 3.2 (*)    All other components within normal limits   RAPID URINE DRUG SCREEN, HOSP PERFORMED - Abnormal; Notable for the following components:   Tetrahydrocannabinol POSITIVE (*)    All other components within normal limits  URINALYSIS, ROUTINE W REFLEX MICROSCOPIC - Abnormal; Notable for the following components:   APPearance HAZY (*)    Leukocytes,Ua TRACE (*)    All other components within normal limits  URINALYSIS, MICROSCOPIC (REFLEX) - Abnormal; Notable for the following components:   Bacteria, UA FEW (*)    All other components within normal limits  RESP PANEL BY RT-PCR (FLU A&B, COVID) ARPGX2  ETHANOL  PROTIME-INR  APTT  CBC  DIFFERENTIAL    EKG EKG Interpretation  Date/Time:  Wednesday March 13 2021 17:16:25 EST Ventricular Rate:  65 PR Interval:  169 QRS Duration: 97 QT Interval:  398 QTC Calculation: 405 R Axis:   26 Text Interpretation: Sinus rhythm Anterior infarct, old Borderline T abnormalities, inferior leads No significant change since last tracing Confirmed by Richardean Canal [phone]) on 03/13/2021 5:24:31 PM  Radiology CT ANGIO HEAD NECK W WO CM  Result Date: 03/13/2021 CLINICAL DATA:  Stroke/TIA, difficulty speaking for 2 days EXAM: CT ANGIOGRAPHY HEAD AND NECK TECHNIQUE: Multidetector CT imaging of the head and neck was performed using the standard protocol during bolus administration of intravenous contrast. Multiplanar CT image reconstructions and MIPs were obtained to evaluate the vascular anatomy. Carotid stenosis measurements (when applicable) are obtained utilizing NASCET criteria, using the distal internal carotid diameter as the denominator. RADIATION DOSE REDUCTION: This exam was performed according to the departmental dose-optimization program which includes automated exposure control, adjustment of the mA and/or kV according to patient size and/or use of iterative reconstruction technique. CONTRAST:  50mL OMNIPAQUE IOHEXOL 350 MG/ML SOLN COMPARISON:  07/24/2020 CTA head neck, 08/23/2020 CT head FINDINGS:  CT HEAD FINDINGS Brain: No evidence of acute infarction, hemorrhage, cerebral edema, mass, mass effect, or midline shift. No hydrocephalus or extra-axial fluid collection. Vascular: No hyperdense vessel. Skull: Normal. Negative for fracture or focal lesion. Sinuses/Orbits: No acute finding. Status post bilateral lens replacements. Other: The mastoid air cells are well aerated. CTA NECK FINDINGS Aortic arch: Standard branching. Imaged portion shows no evidence of aneurysm or dissection. No significant stenosis of the major arch vessel origins. Aortic atherosclerosis. Right carotid system: No evidence of dissection, stenosis (50% or greater) or occlusion. Calcifications at the bifurcation and in the proximal right ICA are not hemodynamically significant. Left carotid system: No evidence of dissection, stenosis (50% or greater) or  occlusion. Vertebral arteries: No evidence of dissection, stenosis (50% or greater) or occlusion. Skeleton: Degenerative changes in the cervical spine. No acute osseous abnormality. Other neck: Redemonstrated enlargement of the thyroid gland, without significant interval change. Upper chest: Centrilobular and paraseptal emphysema. No focal pulmonary opacity. Review of the MIP images confirms the above findings CTA HEAD FINDINGS Anterior circulation: Both internal carotid arteries are patent to the termini, with mild calcifications but without significant stenosis. A1 segments patent. Normal anterior communicating artery. Anterior cerebral arteries are patent to their distal aspects. No M1 stenosis or occlusion. Unchanged 4 mm aneurysm at the left MCA bifurcation. Normal right MCA bifurcation. Distal MCA branches perfused and symmetric. Posterior circulation: Vertebral arteries patent to the vertebrobasilar junction without stenosis. Basilar patent to its distal aspect. Superior cerebellar arteries patent bilaterally. Near fetal origin of the left PCA with a patent left posterior communicating  artery and a diminutive left P1 segment. Normal origin of the right posterior cerebral artery. No right posterior communicating artery is visualized. PCAs perfused to their distal aspects without stenosis. Venous sinuses: As permitted by contrast timing, patent. Anatomic variants: None significant Review of the MIP images confirms the above findings IMPRESSION: 1.  No acute intracranial process. 2. 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 No new aneurysm, large vessel occlusion, or significant stenosis. 3.  No hemodynamically significant stenosis in the neck. Electronically Signed   By: Wiliam KeAlison  Vasan M.D.   On: 03/13/2021 20:06    Procedures Procedures    Medications Ordered in ED Medications  iohexol (OMNIPAQUE) 350 MG/ML injection 80 mL (80 mLs Intravenous Contrast Given 03/13/21 1836)  clopidogrel (PLAVIX) tablet 300 mg (300 mg Oral Given 03/13/21 2026)    ED Course/ Medical Decision Making/ A&P                           Medical Decision Making Sara Chuauline S Bojanowski is a 81 y.o. female here presenting with trouble speaking.  Patient has some expressive aphasia.  It seems to be intermittent since yesterday.  I discussed case with Dr. Thomasena Edisollins from telemetry neurology.  He recommended CTA since she has a history of cerebral aneurysm.   8 pm CTA showed known aneurysm with no rupture.  There is no obvious LVO.  He recommend admission for stroke work-up.  [facility] team. I notified Dr. Wilford CornerArora [facility].  I considered seizure as well.  Patient had negative EEG previously.  She is not on any seizure meds.  Currently with neurology just recommend Plavix loading.    Problems Addressed: Expressive aphasia: acute illness or injury  Amount and/or Complexity of Data Reviewed External Data Reviewed: labs and radiology. Labs: ordered. Decision-making details documented in ED Course. Radiology: ordered and independent interpretation  performed. ECG/medicine tests: ordered and independent interpretation performed.  Final Clinical Impression(s) / ED Diagnoses Final diagnoses:  None    Rx / DC Orders ED Discharge Orders     None         Charlynne PanderYao, Zephyr Sausedo Hsienta, MD 03/13/21 2050

## 2021-03-13 NOTE — ED Notes (Signed)
Pt daughter [name] wants to be contacted when patient is being transported. [phone] cell [phone] Home

## 2021-03-14 ENCOUNTER — Encounter (HOSPITAL_COMMUNITY): Payer: Self-pay | Admitting: Internal Medicine

## 2021-03-14 ENCOUNTER — Observation Stay (HOSPITAL_COMMUNITY): Payer: Medicare Other

## 2021-03-14 DIAGNOSIS — F1290 Cannabis use, unspecified, uncomplicated: Secondary | ICD-10-CM | POA: Diagnosis not present

## 2021-03-14 DIAGNOSIS — F03918 Unspecified dementia, unspecified severity, with other behavioral disturbance: Secondary | ICD-10-CM | POA: Diagnosis not present

## 2021-03-14 DIAGNOSIS — G459 Transient cerebral ischemic attack, unspecified: Secondary | ICD-10-CM | POA: Diagnosis not present

## 2021-03-14 DIAGNOSIS — Z8673 Personal history of transient ischemic attack (TIA), and cerebral infarction without residual deficits: Secondary | ICD-10-CM | POA: Diagnosis not present

## 2021-03-14 DIAGNOSIS — R4701 Aphasia: Secondary | ICD-10-CM | POA: Diagnosis not present

## 2021-03-14 DIAGNOSIS — I10 Essential (primary) hypertension: Secondary | ICD-10-CM

## 2021-03-14 DIAGNOSIS — I509 Heart failure, unspecified: Secondary | ICD-10-CM | POA: Diagnosis not present

## 2021-03-14 DIAGNOSIS — I671 Cerebral aneurysm, nonruptured: Secondary | ICD-10-CM | POA: Diagnosis present

## 2021-03-14 DIAGNOSIS — Z20822 Contact with and (suspected) exposure to covid-19: Secondary | ICD-10-CM | POA: Diagnosis not present

## 2021-03-14 DIAGNOSIS — H409 Unspecified glaucoma: Secondary | ICD-10-CM | POA: Diagnosis not present

## 2021-03-14 DIAGNOSIS — E876 Hypokalemia: Secondary | ICD-10-CM | POA: Diagnosis not present

## 2021-03-14 DIAGNOSIS — I110 Hypertensive heart disease with heart failure: Secondary | ICD-10-CM | POA: Diagnosis not present

## 2021-03-14 DIAGNOSIS — F801 Expressive language disorder: Secondary | ICD-10-CM | POA: Diagnosis not present

## 2021-03-14 DIAGNOSIS — R42 Dizziness and giddiness: Secondary | ICD-10-CM

## 2021-03-14 DIAGNOSIS — Z79899 Other long term (current) drug therapy: Secondary | ICD-10-CM | POA: Diagnosis not present

## 2021-03-14 DIAGNOSIS — R4182 Altered mental status, unspecified: Secondary | ICD-10-CM | POA: Diagnosis present

## 2021-03-14 DIAGNOSIS — Z7982 Long term (current) use of aspirin: Secondary | ICD-10-CM | POA: Diagnosis not present

## 2021-03-14 LAB — LIPID PANEL
Cholesterol: 107 mg/dL (ref 0–200)
HDL: 49 mg/dL (ref >40)
LDL Cholesterol: 53 mg/dL (ref 0–99)
Total CHOL/HDL Ratio: 2.2 RATIO
Triglycerides: 25 mg/dL (ref <150)
VLDL: 5 mg/dL (ref 0–40)

## 2021-03-14 LAB — BASIC METABOLIC PANEL
Anion gap: 10 (ref 5–15)
BUN: 7 mg/dL — ABNORMAL LOW (ref 8–23)
CO2: 25 mmol/L (ref 22–32)
Calcium: 9.1 mg/dL (ref 8.9–10.3)
Chloride: 107 mmol/L (ref 98–111)
Creatinine, Ser: 0.76 mg/dL (ref 0.44–1.00)
GFR, Estimated: >60 mL/min (ref >60)
Glucose, Bld: 89 mg/dL (ref 70–99)
Potassium: 3.4 mmol/L — ABNORMAL LOW (ref 3.5–5.1)
Sodium: 142 mmol/L (ref 135–145)

## 2021-03-14 LAB — HEMOGLOBIN A1C
Hgb A1c MFr Bld: 5.4 % (ref 4.8–5.6)
Mean Plasma Glucose: 108.28 mg/dL

## 2021-03-14 LAB — TSH: TSH: 0.859 u[IU]/mL (ref 0.350–4.500)

## 2021-03-14 LAB — MAGNESIUM: Magnesium: 2 mg/dL (ref 1.7–2.4)

## 2021-03-14 MED ORDER — ACETAMINOPHEN 650 MG RE SUPP: 650.0000 mg | RECTAL | Status: DC | PRN

## 2021-03-14 MED ORDER — HALOPERIDOL LACTATE 5 MG/ML IJ SOLN
5.0000 mg | Freq: Once | INTRAMUSCULAR | Status: AC
Start: 2021-03-14 — End: 2021-03-14
  Administered 2021-03-14: 5 mg via INTRAVENOUS
  Filled 2021-03-14: qty 1

## 2021-03-14 MED ORDER — PANTOPRAZOLE SODIUM 40 MG PO TBEC
40.0000 mg | DELAYED_RELEASE_TABLET | Freq: Every day | ORAL | Status: DC
  Administered 2021-03-14 – 2021-03-15 (×2): 40 mg via ORAL
  Filled 2021-03-14 (×2): qty 1

## 2021-03-14 MED ORDER — LORAZEPAM 2 MG/ML IJ SOLN
0.5000 mg | Freq: Once | INTRAMUSCULAR | Status: AC | PRN
  Administered 2021-03-14: 1 mg via INTRAVENOUS
  Filled 2021-03-14: qty 1

## 2021-03-14 MED ORDER — LATANOPROST 0.005 % OP SOLN
1.0000 [drp] | Freq: Every day | OPHTHALMIC | Status: DC
  Filled 2021-03-14: qty 2.5

## 2021-03-14 MED ORDER — PAROXETINE HCL 10 MG PO TABS
10.0000 mg | ORAL_TABLET | Freq: Every day | ORAL | Status: DC
  Administered 2021-03-14 – 2021-03-15 (×2): 10 mg via ORAL
  Filled 2021-03-14 (×2): qty 1

## 2021-03-14 MED ORDER — CLOPIDOGREL BISULFATE 75 MG PO TABS: 75.0000 mg | ORAL_TABLET | Freq: Every day | ORAL | Status: DC

## 2021-03-14 MED ORDER — POTASSIUM CHLORIDE CRYS ER 20 MEQ PO TBCR
40.0000 meq | EXTENDED_RELEASE_TABLET | ORAL | Status: AC
  Administered 2021-03-14: 40 meq via ORAL
  Filled 2021-03-14: qty 2

## 2021-03-14 MED ORDER — LORAZEPAM 2 MG/ML IJ SOLN
1.0000 mg | Freq: Once | INTRAMUSCULAR | Status: AC
  Administered 2021-03-14: 1 mg via INTRAVENOUS
  Filled 2021-03-14: qty 1

## 2021-03-14 MED ORDER — CLOPIDOGREL BISULFATE 75 MG PO TABS
75.0000 mg | ORAL_TABLET | Freq: Every day | ORAL | Status: DC
  Administered 2021-03-14 – 2021-03-15 (×2): 75 mg via ORAL
  Filled 2021-03-14 (×2): qty 1

## 2021-03-14 MED ORDER — NETARSUDIL DIMESYLATE 0.02 % OP SOLN: 1.0000 [drp] | Freq: Two times a day (BID) | OPHTHALMIC | Status: DC

## 2021-03-14 MED ORDER — POTASSIUM CHLORIDE 10 MEQ/100ML IV SOLN
10.0000 meq | INTRAVENOUS | Status: AC
  Administered 2021-03-14 (×3): 10 meq via INTRAVENOUS
  Filled 2021-03-14 (×3): qty 100

## 2021-03-14 MED ORDER — ASPIRIN 81 MG PO CHEW
81.0000 mg | CHEWABLE_TABLET | Freq: Every day | ORAL | Status: DC
  Administered 2021-03-14 – 2021-03-15 (×2): 81 mg via ORAL
  Filled 2021-03-14 (×2): qty 1

## 2021-03-14 MED ORDER — ACETAMINOPHEN 160 MG/5ML PO SOLN: 650.0000 mg | ORAL | Status: DC | PRN

## 2021-03-14 MED ORDER — ATORVASTATIN CALCIUM 40 MG PO TABS
40.0000 mg | ORAL_TABLET | Freq: Every day | ORAL | Status: DC
  Administered 2021-03-14: 40 mg via ORAL
  Filled 2021-03-14: qty 1

## 2021-03-14 MED ORDER — ACETAMINOPHEN 325 MG PO TABS
650.0000 mg | ORAL_TABLET | ORAL | Status: DC | PRN
  Administered 2021-03-14 (×2): 650 mg via ORAL
  Filled 2021-03-14 (×2): qty 2

## 2021-03-14 MED ORDER — POTASSIUM CHLORIDE 10 MEQ/100ML IV SOLN: 10.0000 meq | INTRAVENOUS | Status: DC

## 2021-03-14 MED ORDER — SODIUM CHLORIDE 0.9 % IV SOLN: Freq: Once | INTRAVENOUS | Status: AC

## 2021-03-14 MED ORDER — ENOXAPARIN SODIUM 40 MG/0.4ML IJ SOSY
40.0000 mg | PREFILLED_SYRINGE | INTRAMUSCULAR | Status: DC
  Administered 2021-03-14: 40 mg via SUBCUTANEOUS
  Filled 2021-03-14: qty 0.4

## 2021-03-14 MED ORDER — STROKE: EARLY STAGES OF RECOVERY BOOK
Freq: Once | Status: AC
  Filled 2021-03-14: qty 1

## 2021-03-14 NOTE — Progress Notes (Incomplete)
Echocardiogram 2D Echocardiogram was attempted but patient was combative and refused.  Angela Mueller 03/14/2021, 2:46 PM

## 2021-03-14 NOTE — ED Notes (Signed)
Post Fall Nursing Note: No changes noted in Neuro Status of Client post fall, Client was still able to move all extremities, able to stand with assistance. Skin remained unremarkable, no bruises, abrasions, skin tears, bleeding noted post fall as well. After fall, client placed in recliner and at room door, with door opened at all times for continued observation and monitoring

## 2021-03-14 NOTE — ED Notes (Signed)
Pt standing at end of bed . Assisted back in bed by staff. Reminded to use call light. Toileting offered.

## 2021-03-14 NOTE — ED Provider Notes (Signed)
5:56 AM Patient became very agitated, insisting that she wanted the windows to her room closed.  She stated that she could feel the wind blowing through the windows and she should could see the cars outside.  It did not help when the patient was told that there were no windows in the room.  The patient refused to believe this.  She was then given Haldol 5 mg for treatment of her delusional episode, likely due in part to sundowning.     Jhayden Demuro, Jonny Ruiz, MD 03/14/21 [phone]

## 2021-03-14 NOTE — Consult Note (Addendum)
Neurology Consultation  CC: Evaluation for stroke Consulting Provider: Madelyn Flavors, MD  History is obtained from: Patient, [name] (daughter), and chart  HPI: Angela Mueller is a 81 y.o. female with PMHx CHF, HTN, tobacco use disorder-severe/dep, Cerebral aneurysm without rupture, previous TIA 03/2020, and multiple ED visits of similar presentation with dizziness and speech disturbance who was transferred from [facility] for MRI and consulted to neurology for evaluation of stroke.  Patient is unsure of the circumstances, but her daughter states that the two went to a store on 1/10 when patient felt dizzy and began to stutter in speech. This episode lasted 5-10 minutes, resolving after the store clerk gave the patient a brownie and water. Daughter reported that the patient had breakfast earlier that morning, then went shopping, returning home right after her episode around 1:30 PM, which was 3.5-6 hours after she ate breakfast. She had no other sx: no weakness, facial droop, incontinence, leaning to one side. In previous episodes, patient's imaging was without acute ischemia or hemorrhage. CTA available on chart with unchanged L MCA 4 mm aneurysm.    LKW: 1/10, episode 5-10 minutes. Patient back to baseline now tpa given?: No, outside of window IR Thrombectomy? No, not indicated Modified Rankin Scale: 0-Completely asymptomatic and back to baseline post- stroke NIHSS: 2; patient is bilaterally blind, but this is due to glaucoma and other chronic changes   ROS: A complete ROS was performed and is negative except as noted in the HPI.   Past Medical History:  Diagnosis Date   Arthritis    CHF (congestive heart failure) (HCC)    GERD (gastroesophageal reflux disease)    Hypertension    Renal disorder    TIA (transient ischemic attack)      History reviewed. No pertinent family history.   Social History:  reports that she has been smoking cigarettes. She has been smoking an  average of 1 pack per day. She has never used smokeless tobacco. She reports current drug use. Drug: Marijuana. She reports that she does not drink alcohol. *Patient denies current drug use but UDS pos THC  Current Scheduled Medications:  enoxaparin (LOVENOX) injection  40 mg Subcutaneous Q24H    Current PRN Medications: acetaminophen **OR** acetaminophen (TYLENOL) oral liquid 160 mg/5 mL **OR** acetaminophen, LORazepam  Exam: Current vital signs: BP (!) 143/53 (BP Location: Right Arm)    Pulse (!) 50    Temp 98.3 F (36.8 C) (Axillary)    Resp 20    Ht 5\' 3"  (1.6 m)    Wt 59.9 kg    SpO2 100%    BMI 23.38 kg/m    Physical Exam  Constitutional: Appears well-developed and well-nourished.  Psych: Affect appropriate to situation Eyes: No scleral injection; clouding of glaucoma present bilaterally, L>R HENT: No OP obstruction Head: Normocephalic.  Cardiovascular: Normal rate and regular rhythm.  Respiratory: Effort normal, non-labored breathing.  GI: Soft.  No distension. There is no tenderness.  Skin: WDI  Neuro: Mental Status: Patient is awake, alert, oriented to person, place, month Patient is unable to give a clear and coherent history, as she does not remember No signs of aphasia or neglect Cranial Nerves: II: UTA visual Fields 2/2 patient blindness. Pupils are equal, round, and reactive to light.   III,IV, VI: EOMI without ptosis or diploplia.  V: Facial sensation is symmetric to light touch.  VII: Face is symmetric resting and smiling VIII: Hearing is intact to voice X: Palate elevates symmetrically. Phonation normal.  XI: Shoulder shrug is symmetric. XII: Tongue protrudes midline without atrophy or fasciculations.  Motor: Tone is normal. Bulk is normal. 5/5 strength was present in all four extremities.  Sensory: Sensation is symmetric to light touch in the arms and legs. No extinction to DSS present.  Deep Tendon Reflexes: 2+ and symmetric in the biceps and patellae.   Cerebellar: UTA as patient with vision deficits   I have reviewed labs in epic and the pertinent results are:  Lab Results  Component Value Date   WBC 8.1 03/13/2021   HGB 14.2 03/13/2021   HCT 43.7 03/13/2021   MCV 91.2 03/13/2021   PLT 259 03/13/2021   CMP     Component Value Date/Time   NA 142 03/14/2021 1138   K 3.4 (L) 03/14/2021 1138   CL 107 03/14/2021 1138   CO2 25 03/14/2021 1138   GLUCOSE 89 03/14/2021 1138   BUN 7 (L) 03/14/2021 1138   CREATININE 0.76 03/14/2021 1138   CALCIUM 9.1 03/14/2021 1138   PROT 8.1 03/13/2021 1745   ALBUMIN 3.8 03/13/2021 1745   AST 21 03/13/2021 1745   ALT 15 03/13/2021 1745   ALKPHOS 109 03/13/2021 1745   BILITOT 0.5 03/13/2021 1745   GFRNONAA >60 03/14/2021 1138   GFRAA 43 (L) 02/24/2014 1315   Lab Results  Component Value Date   HGBA1C 5.4 03/14/2021   Lab Results  Component Value Date   CHOL 107 03/14/2021   HDL 49 03/14/2021   LDLCALC 53 03/14/2021   TRIG 25 03/14/2021   CHOLHDL 2.2 03/14/2021     I have reviewed the images obtained:  CT Angio: No acute hemorrhagic nor ischemic changes. Unchanged L MCA bifurcation aneurysm. No occluded nor stenotic vessels.    Impression: Patient is an 81 year old female evaluated for possible stroke. She has had multiple prior episodes of similar nature, consisting of dizziness and stuttering without further symptoms.  - DDx includes possible hypoglycemic spells. Her daughter noted that she improved after eating a brownie. She reports that patient's blood glucose is not regularly checked at home since she does not carry a diagnosis of DM. All other documented visits state that the episodes lasted at least 15 minutes. This episode lasted 5-10 minutes, improving when eating, so her episodes may be hypoglycemic in nature, but we cannot yet rule out recurrent TIAs or stroke without further imaging. - CT: No acute changes.  - CTA: No occluded or stenotic vessels. Stable left MCA  bifurcation aneurysm.   Recommendations: - HgbA1c 5.41% , fasting lipid panel WNL - MRI of the brain without contrast - Frequent neuro checks - Echocardiogram - Prophylactic therapy-Antiplatelet med: Continue home Aspirin 81 mg and Plavix 75 mg - Risk factor modification - Telemetry monitoring - PT consult, OT consult - Stroke team to follow     Pt seen by Neuro Psych Resident     Lamar Sprinkles, MD PGY-1 03/14/2021  5:40 PM   I have seen and examined the patient. I have discussed the assessment and recommendations with the Neurology resident and have made amendations as needed. 81 year old female with recurrent episodes of dizziness with stuttering speech. Exam is without focal weakness. DDx includes TIAs and hypoglycemic spells. Atypical seizures also on the DDx. Recommendations as above.  Electronically signed: Dr. Caryl Pina

## 2021-03-14 NOTE — ED Notes (Signed)
Pt out of bed. Pt is confused and agitated. Pt wanting window closed and there is no window in the room. Attempted to re-direct pt unsuccessfully. Pt persuaded back into bed. Dr. Read Drivers made aware and orders for medication received.

## 2021-03-14 NOTE — ED Notes (Addendum)
Staff heard sound and upon entering room pt found sitting in floor on buttocks and leaning on chair. Assessed for injuries. None notes at this time. Moving all extremities without complaint of pain. Pts side rails were up and pt had been reminded multiple times prior to use call light and not slide to end of bed. Assisted by staff to gerichair and is now sitting at doorway.Door left open for constant observation by all staff and aware that patient is a high fall risk  Daughter [name] is aware of fall. EDP notified of fall

## 2021-03-14 NOTE — ED Notes (Signed)
Pt agitated and standing and end of med Stating she was going home. Unable to reorient pt to situation. Medication ordered EDP

## 2021-03-14 NOTE — ED Notes (Signed)
Spoke with daughter [name] and is aware that transport is here and being transferred to [facility]

## 2021-03-14 NOTE — Progress Notes (Signed)
Pt received on unit.  

## 2021-03-14 NOTE — Evaluation (Signed)
Speech Language Pathology Evaluation Patient Details Name: AKYLAH WOJAHN MRN: [id number] DOB: 20-Mar-1940 Today's Date: 03/14/2021 Time: [id number] SLP Time Calculation (min) (ACUTE ONLY): 12 min  Problem List:  Patient Active Problem List   Diagnosis Date Noted   Expressive aphasia 03/13/2021   Past Medical History:  Past Medical History:  Diagnosis Date   Arthritis    CHF (congestive heart failure) (HCC)    GERD (gastroesophageal reflux disease)    Hypertension    Renal disorder    TIA (transient ischemic attack)    Past Surgical History: History reviewed. No pertinent surgical history. HPI:  81 year old female presented to [facility] ED with two-day hx of expressive aphasia. PMHx gastroesophageal reflux disease, hypertension, congestive heart failure, cerebral aneurysm without rupture. Ct head negative. MRI pending.   Assessment / Plan / Recommendation Clinical Impression  Pt presents with confusion, making dx of aphasia difficult to identify at this time. She was agitated, hallucinating, difficult to redirect. Speech was fluent. No dysarthria. She followed simple commands, was able to repeat words/sentences and answered four responsive naming questions accurately (e.g, how many eggs are in a dozen).  No further assessment completed as confusion began to escalate.  Offered pt something to drink and this helped to calm her.  MRI is pending. SLP will follow for further assessment as mental status allows.    SLP Assessment  SLP Recommendation/Assessment: Patient needs continued Speech [place] [facility] SLP Visit Diagnosis: Cognitive communication deficit (R41.841)    Recommendations for follow up therapy are one component of a multi-disciplinary discharge planning process, led by the attending physician.  Recommendations may be updated based on patient status, additional functional criteria and insurance authorization.    Follow Up Recommendations  Other (comment)  (tba)    Assistance Recommended at Discharge   tba      Frequency and Duration min 2x/week  2 weeks      SLP Evaluation Cognition  Overall Cognitive Status: Impaired/Different from baseline Orientation Level: Oriented to person;Disoriented to place;Disoriented to time;Disoriented to situation Attention: Focused Focused Attention: Impaired Awareness: Impaired       Comprehension  Auditory Comprehension Overall Auditory Comprehension: Impaired Yes/No Questions: Not tested Commands: Impaired Two Step Basic Commands: 50-74% accurate    Expression Expression Primary Mode of Expression: Verbal Verbal Expression Overall Verbal Expression: Impaired Initiation: No impairment Automatic Speech: Name;Social Response Repetition: No impairment Naming: No impairment   Oral / Motor  Oral Motor/Sensory Function Overall Oral Motor/Sensory Function: Within functional limits Motor Speech Overall Motor Speech: Appears within functional limits for tasks assessed            Juan Quam Laurice 03/14/2021, 3:36 PM Estill Bamberg L. Tivis Ringer, [place] Office number [phone] Pager [phone]

## 2021-03-14 NOTE — H&P (Addendum)
History and Physical    Angela Chuauline S Cannaday ZOX:[id number] DOB: 10/25/1940 DOA: 03/13/2021  Referring MD/NP/PA: Shauna HughGeorge Shalhoub, MD PCP: Tenna DelaineBasrai, Khaishoon N, MD  Patient [facility]:  [facility]  Chief Complaint: Dizziness with change in speech  I have personally briefly reviewed patient's old medical [facility]   HPI: Angela Mueller is a 81 y.o. female with medical history significant of hypertension, congestive heart failure, cerebral aneurysm without rupture, dementia, and GERD presents after being found to have complaints of dizziness with change in speech.  History is obtained from the patient's daughter over the phone as the patient has significant dementia.  At baseline the patient lives in her home and her daughter helps provide care.  They have been out shopping 2 days ago patient had gone to use the restroom sometime after 1 PM and came back complained of feeling dizzy like she could pass out.  They sat her down an, but during this time the patient speech was reported to be stuttered/mumbling and difficult to understand.  Symptoms lasted about 10 minutes and seem to resolve after one of the store employees got her water and a brownie.  And there was no report of any focal weakness, facial droop, complaints of chest pain, palpitations, nausea, vomiting, diarrhea. Her daughter took her home immediately and the patient reportedly slept the rest of the day way.  However, the next morning was still complaining of dizziness and she did not seem to be her normal self for which she brought her to the emergency department for further evaluation.  Daughter notes that the patient has glaucoma and has not able to see very much out of her left eye.  She reports that she chews marijuana to help with glaucoma symptoms.  ED Course: On admission into the emergency department patient was seen as a code stroke.  CT scan of the brain noted no acute intracranial abnormality and atrophy with  chronic small vessel white matter ischemic disease.  Patient was noted to be afebrile, pulse 54-99, respirations 14-22, blood pressures 110/95-173/66, and O2 saturations maintained on room air.  Labs from 1/11 significant for potassium 3.2.  CT angiogram of the head and neck noted unchanged 4 mm cerebral aneurysm without rupture with no evidence of stroke or large vessel occlusion.  Urinalysis noted trace leukocytes with few bacteria.  UDS was positive for marijuana.  Neurology have been consulted and recommended admission into the hospital.  Patient was loaded with Plavix 300 mg p.o.  While in the emergency department patient became acutely confused and agitated noting a window being open despite there being no window in her room.  Thought to be sundowning given Haldol 5 mg IV.  Review of Systems  Unable to perform ROS: Dementia  Neurological:  Positive for dizziness and speech change.  Psychiatric/Behavioral:  Positive for memory loss.    Past Medical History:  Diagnosis Date   Arthritis    CHF (congestive heart failure) (HCC)    GERD (gastroesophageal reflux disease)    Hypertension    Renal disorder    TIA (transient ischemic attack)     History reviewed. No pertinent surgical history.   reports that she has never smoked. She has never used smokeless tobacco. She reports that she does not drink alcohol and does not use drugs.  Allergies  Allergen Reactions   Aspirin Hives   Lisinopril Swelling    History reviewed. No pertinent family history.  Prior to Admission medications  Medication Sig Start Date End Date Taking? Authorizing Provider  amLODipine (NORVASC) 10 MG tablet Take 10 mg by mouth daily. 03/08/20   [provider]  aspirin 81 MG chewable tablet Chew 81 mg by mouth daily. 03/26/20   [provider]  atorvastatin (LIPITOR) 10 MG tablet Take 10 mg by mouth daily. 03/08/20   [provider]  atorvastatin (LIPITOR) 40 MG tablet Take 40 mg by mouth  at bedtime. 02/17/20   [provider]  calcium-vitamin D (OSCAL WITH D) 500-200 MG-UNIT per tablet Take 1 tablet by mouth daily.    [provider]  carvedilol (COREG) 25 MG tablet Take 25 mg by mouth 2 (two) times daily with a meal.    [provider]  clopidogrel (PLAVIX) 75 MG tablet Take 75 mg by mouth daily. 03/13/20   [provider]  losartan (COZAAR) 100 MG tablet Take 100 mg by mouth daily.    [provider]  Multiple Vitamins-Minerals (MULTIVITAMIN WITH MINERALS) tablet Take 1 tablet by mouth daily.    [provider]  omeprazole (PRILOSEC) 20 MG capsule Take 20 mg by mouth daily.    [provider]  spironolactone (ALDACTONE) 25 MG tablet Take 25 mg by mouth daily.    [provider]  traMADol (ULTRAM) 50 MG tablet Take 50 mg by mouth every 6 (six) hours as needed for moderate pain.    [provider]    Physical Exam:  Constitutional: Elderly female who appears to be in no acute distress, but currently in wrist restraints Vitals:   03/14/21 0500 03/14/21 0825 03/14/21 0850 03/14/21 0952  BP: 140/60 (!) 152/66 (!) 152/66 (!) 156/60  Pulse: (!) 54 74  (!) 57  Resp: 17 20 18 20   Temp:   (!) 97.4 F (36.3 C) 97.7 F (36.5 C)  TempSrc:    Oral  SpO2: 98% 100% 100% 99%  Weight:      Height:       Eyes: Poor vision noted from left eye.Extraocular movements otherwise intact  ENMT: Mucous membranes are moist. Posterior pharynx clear of any exudate or lesions.  Neck: normal, supple, no masses, no thyromegaly Respiratory: clear to auscultation bilaterally, no wheezing, no crackles. Normal respiratory effort. Cardiovascular: Regular rate and rhythm, no murmurs / rubs / gallops. No extremity edema. Abdomen: no tenderness, no masses palpated.  Bowel sounds positive.  Musculoskeletal: no clubbing / cyanosis.  No s significant joint deformity appreciated Skin: no rashes, lesions, ulcers. No  induration Neurologic: CN 2-12 grossly intact.  Speech is clear at this time.  Able to move all extremities with strength 5/5. Psychiatric: Poor recent memory.  Oriented only to self.  Thinks that she is currently in a nursing home    Labs on Admission: I have personally reviewed following labs and imaging studies  CBC: Recent Labs  Lab 03/13/21 1745  WBC 8.1  NEUTROABS 4.5  HGB 14.2  HCT 43.7  MCV 91.2  PLT 259   Basic Metabolic Panel: Recent Labs  Lab 03/13/21 1745  NA 138  K 3.2*  CL 104  CO2 25  GLUCOSE 90  BUN 13  CREATININE 0.85  CALCIUM 9.4   GFR: Estimated Creatinine Clearance: 42.2 mL/min (by C-G formula based on SCr of 0.85 mg/dL). Liver Function Tests: Recent Labs  Lab 03/13/21 1745  AST 21  ALT 15  ALKPHOS 109  BILITOT 0.5  PROT 8.1  ALBUMIN 3.8   No results for input(s): LIPASE, AMYLASE in the  last 168 hours. No results for input(s): AMMONIA in the last 168 hours. Coagulation Profile: Recent Labs  Lab 03/13/21 1745  INR 1.0   Cardiac Enzymes: No results for input(s): CKTOTAL, CKMB, CKMBINDEX, TROPONINI in the last 168 hours. BNP (last 3 results) No results for input(s): PROBNP in the last 8760 hours. HbA1C: No results for input(s): HGBA1C in the last 72 hours. CBG: No results for input(s): GLUCAP in the last 168 hours. Lipid Profile: No results for input(s): CHOL, HDL, LDLCALC, TRIG, CHOLHDL, LDLDIRECT in the last 72 hours. Thyroid Function Tests: No results for input(s): TSH, T4TOTAL, FREET4, T3FREE, THYROIDAB in the last 72 hours. Anemia Panel: No results for input(s): VITAMINB12, FOLATE, FERRITIN, TIBC, IRON, RETICCTPCT in the last 72 hours. Urine analysis:    Component Value Date/Time   COLORURINE YELLOW 03/13/2021 1745   APPEARANCEUR HAZY (A) 03/13/2021 1745   LABSPEC 1.025 03/13/2021 1745   PHURINE 5.5 03/13/2021 1745   GLUCOSEU NEGATIVE 03/13/2021 1745   HGBUR NEGATIVE 03/13/2021 1745   BILIRUBINUR NEGATIVE 03/13/2021  1745   KETONESUR NEGATIVE 03/13/2021 1745   PROTEINUR NEGATIVE 03/13/2021 1745   UROBILINOGEN 1.0 02/24/2014 1433   NITRITE NEGATIVE 03/13/2021 1745   LEUKOCYTESUR TRACE (A) 03/13/2021 1745   Sepsis Labs: Recent Results (from the past 240 hour(s))  Resp Panel by RT-PCR (Flu A&B, Covid) Nasopharyngeal Swab     Status: None   Collection Time: 03/13/21  5:45 PM   Specimen: Nasopharyngeal Swab; Nasopharyngeal(NP) swabs in vial transport medium  Result Value Ref Range Status   SARS Coronavirus 2 by RT PCR NEGATIVE NEGATIVE Final    Comment: (NOTE) SARS-CoV-2 target nucleic acids are NOT DETECTED.  The SARS-CoV-2 RNA is generally detectable in upper respiratory specimens during the acute phase of infection. The lowest concentration of SARS-CoV-2 viral copies this assay can detect is 138 copies/mL. A negative result does not preclude SARS-Cov-2 infection and should not be used as the sole basis for treatment or other patient management decisions. A negative result may occur with  improper specimen collection/handling, submission of specimen other than nasopharyngeal swab, presence of viral mutation(s) within the areas targeted by this assay, and inadequate number of viral copies(<138 copies/mL). A negative result must be combined with clinical observations, patient history, and epidemiological information. The expected result is Negative.  Fact Sheet for Patients:  BloggerCourse.com  Fact Sheet for Healthcare Providers:  SeriousBroker.it  This test is no t yet approved or cleared by the [place] FDA and  has been authorized for detection and/or diagnosis of SARS-CoV-2 by FDA under an Emergency Use Authorization (EUA). This EUA will remain  in effect (meaning this test can be used) for the duration of the COVID-19 declaration under Section 564(b)(1) of the Act, 21 U.S.C.section 360bbb-3(b)(1), unless the authorization is  terminated  or revoked sooner.       Influenza A by PCR NEGATIVE NEGATIVE Final   Influenza B by PCR NEGATIVE NEGATIVE Final    Comment: (NOTE) The Xpert Xpress SARS-CoV-2/FLU/RSV plus assay is intended as an aid in the diagnosis of influenza from Nasopharyngeal swab specimens and should not be used as a sole basis for treatment. Nasal washings and aspirates are unacceptable for Xpert Xpress SARS-CoV-2/FLU/RSV testing.  Fact Sheet for Patients: BloggerCourse.com  Fact Sheet for Healthcare Providers: SeriousBroker.it  This test is not yet approved or cleared by the [place] FDA and has been authorized for detection and/or diagnosis of SARS-CoV-2 by FDA under an Emergency Use Authorization (EUA). This EUA will  remain in effect (meaning this test can be used) for the duration of the COVID-19 declaration under Section 564(b)(1) of the Act, 21 U.S.C. section 360bbb-3(b)(1), unless the authorization is terminated or revoked.  Performed at [facility], [address] [place], [place] [address]      Radiological Exams on Admission: CT ANGIO HEAD NECK W WO CM  Result Date: 03/13/2021 CLINICAL DATA:  Stroke/TIA, difficulty speaking for 2 days EXAM: CT ANGIOGRAPHY HEAD AND NECK TECHNIQUE: Multidetector CT imaging of the head and neck was performed using the standard protocol during bolus administration of intravenous contrast. Multiplanar CT image reconstructions and MIPs were obtained to evaluate the vascular anatomy. Carotid stenosis measurements (when applicable) are obtained utilizing NASCET criteria, using the distal internal carotid diameter as the denominator. RADIATION DOSE REDUCTION: This exam was performed according to the departmental dose-optimization program which includes automated exposure control, adjustment of the mA and/or kV according to patient size and/or use of iterative reconstruction technique.  CONTRAST:  64mL OMNIPAQUE IOHEXOL 350 MG/ML SOLN COMPARISON:  07/24/2020 CTA head neck, 08/23/2020 CT head FINDINGS: CT HEAD FINDINGS Brain: No evidence of acute infarction, hemorrhage, cerebral edema, mass, mass effect, or midline shift. No hydrocephalus or extra-axial fluid collection. Vascular: No hyperdense vessel. Skull: Normal. Negative for fracture or focal lesion. Sinuses/Orbits: No acute finding. Status post bilateral lens replacements. Other: The mastoid air cells are well aerated. CTA NECK FINDINGS Aortic arch: Standard branching. Imaged portion shows no evidence of aneurysm or dissection. No significant stenosis of the major arch vessel origins. Aortic atherosclerosis. Right carotid system: No evidence of dissection, stenosis (50% or greater) or occlusion. Calcifications at the bifurcation and in the proximal right ICA are not hemodynamically significant. Left carotid system: No evidence of dissection, stenosis (50% or greater) or occlusion. Vertebral arteries: No evidence of dissection, stenosis (50% or greater) or occlusion. Skeleton: Degenerative changes in the cervical spine. No acute osseous abnormality. Other neck: Redemonstrated enlargement of the thyroid gland, without significant interval change. Upper chest: Centrilobular and paraseptal emphysema. No focal pulmonary opacity. Review of the MIP images confirms the above findings CTA HEAD FINDINGS Anterior circulation: Both internal carotid arteries are patent to the termini, with mild calcifications but without significant stenosis. A1 segments patent. Normal anterior communicating artery. Anterior cerebral arteries are patent to their distal aspects. No M1 stenosis or occlusion. Unchanged 4 mm aneurysm at the left MCA bifurcation. Normal right MCA bifurcation. Distal MCA branches perfused and symmetric. Posterior circulation: Vertebral arteries patent to the vertebrobasilar junction without stenosis. Basilar patent to its distal aspect. Superior  cerebellar arteries patent bilaterally. Near fetal origin of the left PCA with a patent left posterior communicating artery and a diminutive left P1 segment. Normal origin of the right posterior cerebral artery. No right posterior communicating artery is visualized. PCAs perfused to their distal aspects without stenosis. Venous sinuses: As permitted by contrast timing, patent. Anatomic variants: None significant Review of the MIP images confirms the above findings IMPRESSION: 1.  No acute intracranial process. 2. 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 No new aneurysm, large vessel occlusion, or significant stenosis. 3.  No hemodynamically significant stenosis in the neck. Electronically Signed   By: Wiliam Ke M.D.   On: 03/13/2021 20:06    EKG: Independently reviewed.  Sinus rhythm at 65 bpm  Assessment/Plan Dizziness and change in speech: Acute.  Patient presents after having reports of dizziness with change in speech which seem to improve some after given food and water.  Initial work-up with CT scan of the head did not note any acute abnormalities and CTA of the head and neck noted unchanged 4 mm left MCA aneurysm.  Neurology had been consulted and recommended transfer to [facility] for further work-up.  Question possibility of TIA/stroke versus orthostatic hypotension related with blood pressure medications versus other. -Admit to telemetry bed -Stroke order set initiated -Neuro checks -Check  MRI brain -Check Hemoglobin A1c, lipid panel, and TSH -Check echocardiogram -Recommend checking orthostatic vital signs -PT/OT to evaluate and treat -Continue aspirin and Plavix -Follow-up telemetry overnight -Social work consult  -Appreciate neurology consultative services, will follow-up for any further recommendations  Dementia, with behavioral disturbance: Patient has dementia and was noted to be agitated while in emergency department requiring Haldol thought to possibly be  sundowning. -Delirium precautions -Soft restraints/sitter needed if family unable to stay with patient  Cerebral aneurysm, without rupture: Stable.  Imaging noted unchanged 4 mm left MCA aneurysm present. -Continue outpatient follow-up and surveillance  Essential hypertension: Blood pressures currently maintained.  Home blood pressure regimen includes amlodipine 10 mg daily, hydrochlorothiazide 12.5 mg daily, and losartan 100 mg daily. -Allow for permissive hypertension in the setting of possible TIA/stroke -Resume when medically appropriate, but consider discontinuation of hydrochlorothiazide as this can be a cause for dehydration especially as her dementia advances   Marijuana use: Acute.  Patient chews marijuana for her glaucoma.  Urine drug screen was positive for marijuana which which may have also been a contributing factor in patient's symptoms.   Hypokalemia: Acute.  Potassium was initially noted to be 3.2.  She is on hydrochlorothiazide which is likely contributing. -Give potassium chloride 40 meq p.o. -Continue to monitor and replace as needed  Glaucoma -Continue home eyedrop   DVT prophylaxis: Lovenox Code Status: Full  Family Communication: Daughter updated over the phone Disposition Plan: likely discharge home in am if work-up negative Consults called: Neurology Admission status: Observation  Clydie Braun MD [facility]   If 7PM-7AM, please contact night-coverage   03/14/2021, 11:05 AM

## 2021-03-14 NOTE — ED Notes (Signed)
Pt woke confused. Pt states im not crazy let me out of here. Pt requesting to call he daughter. Pt provided with a phone and daughter [name] number dialed. Pt spoke with her daughter and calmed a bit. Pt requesting to go outside and smoke but was easily redirected. Pt laying back down in bed and hooked back up to the monitor. This nurse called pt daughter back and spoke with her about this episode, daughter states she isnt normally confused when she wakes but she is never away from home either. After a few minutes with the patient she was reoriented. Will continue to monitor.

## 2021-03-14 NOTE — TOC CAGE-AID Note (Signed)
Transition of Care [facility]) - CAGE-AID Screening   Patient Details  Name: Angela Mueller MRN: [id number] Date of Birth: 03-22-1940  Transition of Care [facility]) CM/SW Contact:    Ophie Burrowes C Tarpley-Carter, LCSWA Phone Number: 03/14/2021, 1:36 PM   Clinical Narrative: Pt is unable to participate in Cage Aid.  CSW will assess at a better time.  Roni Scow Tarpley-Carter, MSW, LCSW-A Pronouns:  She/Her/Hers [place] Transitions of Care Clinical Social Worker Direct Number:  [phone] [email or web address]  CAGE-AID Screening: Substance Abuse Screening unable to be completed due to: : Patient unable to participate             Substance Abuse Education Offered: Yes  Substance abuse interventions: Transport planner

## 2021-03-14 NOTE — TOC Initial Note (Signed)
Transition of Care [facility]) - Initial/Assessment Note    Patient Details  Name: Angela Mueller MRN: [id number] Date of Birth: November 27, 1940  [facility] Care [facility]) CM/SW Contact:    Pollie Friar, RN Phone Number: 03/14/2021, 2:50 PM  Clinical Narrative:                 Patient is confused. CM met with the patient and her daughter at the bedside. Daughter denies any DME at home. Daughter manages her medications and transportation. Daughter states she is with her all the time for supervision. Awaiting PT/OT evals.  TOC following.  Expected Discharge Plan: Home/Self Care Barriers to Discharge: Continued Medical Work up   Patient Goals and CMS Choice   CMS Medicare.gov Compare Post Acute Care list provided to:: Patient Represenative (must comment) Choice offered to / list presented to : Adult Children  Expected Discharge Plan and Services Expected Discharge Plan: Home/Self Care   Discharge Planning Services: CM Consult   Living arrangements for the past 2 months: Single Family Home                                      Prior Living Arrangements/Services Living arrangements for the past 2 months: Single Family Home Lives with:: Adult Children Patient language and need for interpreter reviewed:: Yes          Care giver support system in place?: Yes (comment)   Criminal Activity/Legal Involvement Pertinent to Current Situation/Hospitalization: No - Comment as needed  Activities of Daily Living      Permission Sought/Granted                  Emotional Assessment Appearance:: Appears stated age     Orientation: : Oriented to Self   Psych Involvement: No (comment)  Admission diagnosis:  Expressive aphasia [R47.01] Patient Active Problem List   Diagnosis Date Noted   Expressive aphasia 03/13/2021   PCP:  Lurline Hare, MD Pharmacy:   [facility], [address] [place] [id number] Phone: [phone] Fax: [phone]     Social Determinants of Health (SDOH) Interventions    Readmission Risk Interventions No flowsheet data found.

## 2021-03-15 ENCOUNTER — Observation Stay (HOSPITAL_BASED_OUTPATIENT_CLINIC_OR_DEPARTMENT_OTHER): Payer: Medicare Other

## 2021-03-15 DIAGNOSIS — I10 Essential (primary) hypertension: Secondary | ICD-10-CM | POA: Diagnosis not present

## 2021-03-15 DIAGNOSIS — R42 Dizziness and giddiness: Secondary | ICD-10-CM

## 2021-03-15 DIAGNOSIS — R4701 Aphasia: Secondary | ICD-10-CM | POA: Diagnosis not present

## 2021-03-15 DIAGNOSIS — I671 Cerebral aneurysm, nonruptured: Secondary | ICD-10-CM

## 2021-03-15 DIAGNOSIS — F03918 Unspecified dementia, unspecified severity, with other behavioral disturbance: Secondary | ICD-10-CM

## 2021-03-15 LAB — ECHOCARDIOGRAM COMPLETE
AR max vel: 2.12 cm2
AV Area VTI: 2.26 cm2
AV Area mean vel: 2.07 cm2
AV Mean grad: 5 mmHg
AV Peak grad: 8.6 mmHg
Ao pk vel: 1.47 m/s
Area-P 1/2: 2.22 cm2
Height: 63 in
MV VTI: 1.37 cm2
S' Lateral: 2.6 cm
Weight: 2112 oz

## 2021-03-15 MED ORDER — NICOTINE 14 MG/24HR TD PT24
14.0000 mg | MEDICATED_PATCH | Freq: Every day | TRANSDERMAL | Status: DC
  Administered 2021-03-15: 14 mg via TRANSDERMAL
  Filled 2021-03-15: qty 1

## 2021-03-15 NOTE — Evaluation (Signed)
Occupational Therapy Evaluation Patient Details Name: Angela Mueller MRN: [id number] DOB: Oct 16, 1940 Today's Date: 03/15/2021   History of Present Illness 81 yo female presents to [facility] with dizziness, speech disturbance. Recent admission 1/11 with c/c AMS, aphasia, workup for TIA; multiple ED visits for similar. 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 PMH includes HF, GERD, TIA, HTN, dementia.   Clinical Impression   Pt admitted with above. She demonstrates the below listed deficits and will benefit from continued OT to maximize safety and independence with BADLs.  Pt presents to OT with impaired cognition and impaired balance.  She currently requires min guard assist for ADLs.  She lives withher daughter and was able to complete ADLs mod I.  Will follow.        Recommendations for follow up therapy are one component of a multi-disciplinary discharge planning process, led by the attending physician.  Recommendations may be updated based on patient status, additional functional criteria and insurance authorization.   Follow Up Recommendations  Home health OT    Assistance Recommended at Discharge Frequent or constant Supervision/Assistance  Patient can return home with the following Direct supervision/assist for medications management;Direct supervision/assist for financial management;A little help with walking and/or transfers;A little help with bathing/dressing/bathroom;Assist for transportation    Functional Status Assessment  Patient has had a recent decline in their functional status and demonstrates the ability to make significant improvements in function in a reasonable and predictable amount of time.  Equipment Recommendations  None recommended by OT    Recommendations for Other Services       Precautions / Restrictions Precautions Precautions: Fall Precaution Comments: sitter present      Mobility Bed Mobility Overal bed mobility: Needs  Assistance Bed Mobility: Supine to Sit;Sit to Supine     Supine to sit: Supervision Sit to supine: Modified independent (Device/Increase time)        Transfers                          Balance Overall balance assessment: Needs assistance Sitting-balance support: No upper extremity supported;Feet supported Sitting balance-Leahy Scale: Good     Standing balance support: No upper extremity supported;During functional activity Standing balance-Leahy Scale: Fair                             ADL either performed or assessed with clinical judgement   ADL Overall ADL's : Needs assistance/impaired Eating/Feeding: Independent   Grooming: Wash/dry hands;Wash/dry face;Oral care;Supervision/safety;Standing   Upper Body Bathing: Set up;Supervision/ safety;Sitting   Lower Body Bathing: Supervison/ safety;Sit to/from stand   Upper Body Dressing : Set up;Supervision/safety;Sitting   Lower Body Dressing: Supervision/safety;Sit to/from stand   Toilet Transfer: Ambulation;Comfort height toilet;Min guard   Toileting- Clothing Manipulation and Hygiene: Supervision/safety;Sit to/from stand       Functional mobility during ADLs: Min guard       Vision Patient Visual Report: No change from baseline       Perception     Praxis      Pertinent Vitals/Pain Pain Assessment: No/denies pain     Hand Dominance     Extremity/Trunk Assessment Upper Extremity Assessment Upper Extremity Assessment: Overall WFL for tasks assessed   Lower Extremity Assessment Lower Extremity Assessment: Defer to PT evaluation   Cervical / Trunk Assessment Cervical / Trunk Assessment: Normal   Communication Communication Communication: No difficulties   Cognition Arousal/Alertness:  Awake/alert Behavior During Therapy: WFL for tasks assessed/performed Overall Cognitive Status: No family/caregiver present to determine baseline cognitive functioning                                  General Comments: Pt disoriented to time and situation.  She followed simple commands.  She occasionally yelled out for her daughter.   Confusion noted.     General Comments       Exercises     Shoulder Instructions      Home Living Family/patient expects to be discharged to:: Private residence Living Arrangements: Children Available Help at Discharge: Family;Available 24 hours/day Type of Home: House Home Access: Level entry     Home Layout: One level               Home Equipment: None          Prior Functioning/Environment Prior Level of Function : Independent/Modified Independent             Mobility Comments: pt, and daughter Angela Mueller via telephone, state pt is independent with mobility at baseline with no history of falls ADLs Comments: pt and daughter Angela Mueller say pt is independent with ADLs, pt states she needs help with "the last button" sometimes with dressing. Pt's daughter drives her to appointments/errands        OT Problem List: Decreased activity tolerance;Impaired balance (sitting and/or standing);Decreased cognition;Decreased safety awareness      OT Treatment/Interventions: Self-care/ADL training;DME and/or AE instruction;Therapeutic activities;Cognitive remediation/compensation;Patient/family education;Balance training    OT Goals(Current goals can be found in the care plan section) Acute Rehab OT Goals Patient Stated Goal: unable to state OT Goal Formulation: With patient Time For Goal Achievement: 03/29/21 Potential to Achieve Goals: Good ADL Goals Pt Will Perform Grooming: with modified independence;standing Pt Will Perform Upper Body Bathing: with modified independence;standing;sitting Pt Will Perform Lower Body Bathing: with modified independence;sit to/from stand Pt Will Perform Upper Body Dressing: with modified independence;sitting Pt Will Perform Lower Body Dressing: with modified independence;sit to/from  stand Pt Will Transfer to Toilet: with modified independence;ambulating Pt Will Perform Toileting - Clothing Manipulation and hygiene: with modified independence;sit to/from stand  OT Frequency: Min 2X/week    Co-evaluation              AM-PAC OT "6 Clicks" Daily Activity     Outcome Measure Help from another person eating meals?: None Help from another person taking care of personal grooming?: A Little Help from another person toileting, which includes using toliet, bedpan, or urinal?: A Little Help from another person bathing (including washing, rinsing, drying)?: A Little Help from another person to put on and taking off regular upper body clothing?: A Little Help from another person to put on and taking off regular lower body clothing?: A Little 6 Click Score: 19   End of Session Nurse Communication: Mobility status  Activity Tolerance: Patient tolerated treatment well Patient left: in bed;with call bell/phone within reach;with bed alarm set;with nursing/sitter in room  OT Visit Diagnosis: Unsteadiness on feet (R26.81);Cognitive communication deficit (R41.841)                Time: 9518-8416 OT Time Calculation (min): 25 min Charges:  OT General Charges $OT Visit: 1 Visit OT Evaluation $OT Eval Moderate Complexity: 1 Mod OT Treatments $Self Care/Home Management : 8-22 mins  Eber Jones., OTR/L Acute Rehabilitation Services Pager [phone] Office [phone]   Jeani Hawking M  03/15/2021, 4:55 PM

## 2021-03-15 NOTE — Evaluation (Signed)
Physical Therapy Evaluation Patient Details Name: Angela Mueller MRN: [id number] DOB: December 11, 1940 Today's Date: 03/15/2021  History of Present Illness  81 yo female presents to [facility] with dizziness, speech disturbance. Recent admission 1/11 with c/c AMS, aphasia, workup for TIA; multiple ED visits for similar. 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 PMH includes HF, GERD, TIA, HTN, dementia.  Clinical Impression   Pt presents with generalized weakness, impaired balance, poor safety awareness with history of dementia, and decreased activity tolerance vs baseline. Pt to benefit from acute PT to address deficits. Pt ambulated short hallway distance, requiring intermittent steadying assist given pt unsteadiness and dizziness, BP WFL but PT requested orthostatic vitals after pt rests to rule out BP drop. At baseline, pt is independent at home and has assist from daughter 24/7 if needed. PT to progress mobility as tolerated, and will continue to follow acutely.         Recommendations for follow up therapy are one component of a multi-disciplinary discharge planning process, led by the attending physician.  Recommendations may be updated based on patient status, additional functional criteria and insurance authorization.  Follow Up Recommendations Home health PT    Assistance Recommended at Discharge Frequent or constant Supervision/Assistance  Patient can return home with the following  A little help with walking and/or transfers;A little help with bathing/dressing/bathroom;Assist for transportation;Assistance with cooking/housework    Equipment Recommendations Rolling walker (2 wheels)  Recommendations for Other Services       Functional Status Assessment Patient has had a recent decline in their functional status and demonstrates the ability to make significant improvements in function in a reasonable and predictable amount of time.     Precautions /  Restrictions Precautions Precautions: Fall Precaution Comments: wrist restraints donned but pt not restrained Restrictions Weight Bearing Restrictions: No      Mobility  Bed Mobility Overal bed mobility: Needs Assistance Bed Mobility: Sit to Supine       Sit to supine: Supervision;HOB elevated   General bed mobility comments: for safety, cues for sequencing and scooting up in bed.    Transfers Overall transfer level: Needs assistance Equipment used: 1 person hand held assist Transfers: Sit to/from Stand Sit to Stand: Min assist           General transfer comment: light rise and steadying assist    Ambulation/Gait Ambulation/Gait assistance: Min assist Gait Distance (Feet): 80 Feet Assistive device: None Gait Pattern/deviations: Step-through pattern;Decreased stride length;Trunk flexed;Drifts right/left Gait velocity: decr     General Gait Details: occasional steadying assist with pt reaching for environment to self-steady, cues for upright posture and keeping eyes open. Intermittent dizziness, BP and HR 126/71 and 93 bpm respectively.  Stairs            Wheelchair Mobility    Modified Rankin (Stroke Patients Only) Modified Rankin (Stroke Patients Only) Pre-Morbid Rankin Score: Slight disability Modified Rankin: Moderately severe disability     Balance Overall balance assessment: Needs assistance Sitting-balance support: No upper extremity supported;Feet supported Sitting balance-Leahy Scale: Good     Standing balance support: No upper extremity supported;During functional activity Standing balance-Leahy Scale: Fair                               Pertinent Vitals/Pain Pain Assessment: Faces Faces Pain Scale: Hurts little more Pain Location: Bilat LEs Pain Descriptors / Indicators: Sore;Tightness Pain Intervention(s): Limited activity within patient's tolerance;Monitored  during session;Repositioned    Home Living Family/patient  expects to be discharged to:: Private residence Living Arrangements: Children (daughter [name]) Available Help at Discharge: Family;Available 24 hours/day Type of Home: House Home Access: Level entry       Home Layout: One level Home Equipment: None      Prior Function Prior Level of Function : Independent/Modified Independent             Mobility Comments: pt, and daughter [name] via telephone, state pt is independent with mobility at baseline with no history of falls ADLs Comments: pt and daughter [name] say pt is independent with ADLs, pt states she needs help with "the last button" sometimes with dressing. Pt's daughter drives her to appointments/errands     Hand Dominance   Dominant Hand: Right    Extremity/Trunk Assessment   Upper Extremity Assessment Upper Extremity Assessment: Defer to OT evaluation    Lower Extremity Assessment Lower Extremity Assessment: Generalized weakness (full AROM LEs siting EOB)    Cervical / Trunk Assessment Cervical / Trunk Assessment: Normal  Communication   Communication: No difficulties  Cognition Arousal/Alertness: Awake/alert Behavior During Therapy: WFL for tasks assessed/performed Overall Cognitive Status: History of cognitive impairments - at baseline                                 General Comments: Per RN, pt with history of dementia. Pt pleasant and following all commands on PT eval, states year is 2022 but corrects to 2023 when cued by PT. Lacks some insight into safety and deficits, i.e. tells PT to stop guarding when pt is unsteady        General Comments General comments (skin integrity, edema, etc.): PT advised RN to assess orthostatic vitals to ensure no BP drop during mobility    Exercises     Assessment/Plan    PT Assessment Patient needs continued PT services  PT Problem List Decreased strength;Decreased mobility;Decreased safety awareness;Decreased cognition;Decreased activity  tolerance;Decreased balance;Decreased knowledge of use of DME;Pain       PT Treatment Interventions DME instruction;Therapeutic activities;Gait training;Therapeutic exercise;Patient/family education;Balance training;Neuromuscular re-education;Functional mobility training    PT Goals (Current goals can be found in the Care Plan section)  Acute Rehab PT Goals Patient Stated Goal: pt home PT Goal Formulation: With patient/family Time For Goal Achievement: 03/29/21 Potential to Achieve Goals: Good    Frequency Min 3X/week     Co-evaluation               AM-PAC PT "6 Clicks" Mobility  Outcome Measure Help needed turning from your back to your side while in a flat bed without using bedrails?: A Little Help needed moving from lying on your back to sitting on the side of a flat bed without using bedrails?: A Little Help needed moving to and from a bed to a chair (including a wheelchair)?: A Little Help needed standing up from a chair using your arms (e.g., wheelchair or bedside chair)?: A Little Help needed to walk in hospital room?: A Little Help needed climbing 3-5 steps with a railing? : A Little 6 Click Score: 18    End of Session Equipment Utilized During Treatment: Gait belt Activity Tolerance: Patient tolerated treatment well Patient left: in bed;with call bell/phone within reach;with bed alarm set;with nursing/sitter in room (sitter at bedside) Nurse Communication: Mobility status PT Visit Diagnosis: Other abnormalities of gait and mobility (R26.89);Muscle weakness (generalized) (M62.81)  Time: [id number] PT Time Calculation (min) (ACUTE ONLY): 16 min   Charges:   PT Evaluation $PT Eval Low Complexity: 1 Low          Girtrude Enslin S, PT DPT Acute Rehabilitation Services Pager [phone]  Office [phone]   Arilyn Brierley E Christain SacramentoStroup 03/15/2021, 11:30 AM

## 2021-03-15 NOTE — Care Management Obs Status (Signed)
MEDICARE OBSERVATION STATUS NOTIFICATION   Patient Details  Name: Angela Mueller MRN: [id number] Date of Birth: Aug 12, 1940   Medicare Observation Status Notification Given:  Yes    Kermit Balo, RN 03/15/2021, 12:11 PM

## 2021-03-15 NOTE — TOC CAGE-AID Note (Signed)
Transition of Care [facility]) - CAGE-AID Screening   Patient Details  Name: Angela Mueller MRN: [id number] Date of Birth: 10/11/1940  Transition of Care [facility]) CM/SW Contact:    Keny Donald C Tarpley-Carter, LCSWA Phone Number: 03/15/2021, 3:05 PM   Clinical Narrative: Pt is unable to participate in Cage Aid. CSW made second attempt to assess pt.  Pt is currently a poor historian.  CSW will provide pt with resources for possible future use.  Sai Moura Tarpley-Carter, MSW, LCSW-A Pronouns:  She/Her/Hers [place] Transitions of Care Clinical Social Worker Direct Number:  [phone] [email or web address]  CAGE-AID Screening: Substance Abuse Screening unable to be completed due to: : Patient unable to participate             Substance Abuse Education Offered: Yes  Substance abuse interventions: Transport planner

## 2021-03-15 NOTE — Progress Notes (Addendum)
STROKE TEAM PROGRESS NOTE   INTERVAL HISTORY Her daughter is at the bedside.  Patient is poor historian, hard to get good story from pt but her daughter said she had dizziness (lightheadedness vs. vertigo) when she stood up from sitting position yesterday with slurry speech, lasted 5 min. She did the same thing 3 weeks ago, also short lived.  No BP checked at home.  Currently, patient symptoms free, eating lunch in bed.  Vitals:   03/14/21 1243 03/14/21 1925 03/14/21 2316 03/15/21 0245  BP: (!) 143/53 (!) 154/54 (!) 114/41 (!) 122/48  Pulse: (!) 50 (!) 52 (!) 54 (!) 51  Resp:  16 16 16   Temp: 98.3 F (36.8 C) 98.6 F (37 C) 98.6 F (37 C) 97.6 F (36.4 C)  TempSrc: Axillary Axillary Oral Oral  SpO2: 100% 98% 97% 95%  Weight:      Height:       CBC:  Recent Labs  Lab 03/13/21 1745  WBC 8.1  NEUTROABS 4.5  HGB 14.2  HCT 43.7  MCV 91.2  PLT 259   Basic Metabolic Panel:  Recent Labs  Lab 03/13/21 1745 03/14/21 1138  NA 138 142  K 3.2* 3.4*  CL 104 107  CO2 25 25  GLUCOSE 90 89  BUN 13 7*  CREATININE 0.85 0.76  CALCIUM 9.4 9.1  MG  --  2.0   Lipid Panel:  Recent Labs  Lab 03/14/21 1138  CHOL 107  TRIG 25  HDL 49  CHOLHDL 2.2  VLDL 5  LDLCALC 53   HgbA1c:  Recent Labs  Lab 03/14/21 1138  HGBA1C 5.4   Urine Drug Screen:  Recent Labs  Lab 03/13/21 1745  LABOPIA NONE DETECTED  COCAINSCRNUR NONE DETECTED  LABBENZ NONE DETECTED  AMPHETMU NONE DETECTED  THCU POSITIVE*  LABBARB NONE DETECTED    Alcohol Level  Recent Labs  Lab 03/13/21 1745  ETH <10    IMAGING past 24 hours MR BRAIN WO CONTRAST  Result Date: 03/14/2021 CLINICAL DATA:  Stroke, follow-up, sudden onset dizziness and slurred speech EXAM: MRI HEAD WITHOUT CONTRAST TECHNIQUE: Multiplanar, multiecho pulse sequences of the brain and surrounding structures were obtained without intravenous contrast. COMPARISON:  MRI 07/24/2020, correlation is also made with 03/13/2021 CTA head neck.  FINDINGS: Evaluation is limited by motion and patient inability to cooperate and complete exam. Only diffusion-weighted, FLAIR, and susceptibility weighted sequences were obtained. No restricted diffusion to suggest acute or subacute infarct. No foci of hemosiderin deposition to suggest remote hemorrhage. Mineralization in the basal ganglia. Moderate T2 hyperintense signal in the periventricular white matter, likely the sequela of chronic small vessel ischemic disease. IMPRESSION: Limited evaluation, secondary to motion artifact and patient inability to complete the exam. Limited sequences obtained demonstrate no acute or subacute infarct. Electronically Signed   By: 05/11/2021 M.D.   On: 03/14/2021 19:47    PHYSICAL EXAM  Temp:  [97.6 F (36.4 C)-98.6 F (37 C)] 98 F (36.7 C) (01/13 0757) Pulse Rate:  [51-65] 65 (01/13 0757) Resp:  [16-18] 18 (01/13 0757) BP: (114-155)/(41-70) 155/70 (01/13 0757) SpO2:  [95 %-98 %] 97 % (01/13 0757)  General - Well nourished, well developed, in no apparent distress.  Ophthalmologic - fundi not visualized due to noncooperation.  Cardiovascular - Regular rhythm and rate.  Mental Status -  Level of arousal and orientation to time, place, and person were intact. Language including expression, naming, repetition, comprehension was assessed and found intact.  Mild psychomotor slowing  Cranial Nerves  II - XII - II - Visual acuity decreased b/l due to glaucoma, able to see HW bilateral visual field.  III, IV, VI - Extraocular movements intact. V - Facial sensation intact bilaterally. VII - Facial movement intact bilaterally. VIII - Hearing & vestibular intact bilaterally. X - Palate elevates symmetrically.  Slight dysarthria XI - Chin turning & shoulder shrug intact bilaterally. XII - Tongue protrusion intact.  Motor Strength - The patients strength was normal in all extremities and pronator drift was absent.  Bulk was normal and fasciculations were  absent.   Motor Tone - Muscle tone was assessed at the neck and appendages and was normal.  Reflexes - The patients reflexes were symmetrical in all extremities and she had no pathological reflexes.  Sensory - Light touch, temperature/pinprick were assessed and were symmetrical.    Coordination - The patient had normal movements in the hands and feet with no ataxia or dysmetria.  Tremor was absent.  Gait and Station - deferred.   ASSESSMENT/PLAN Ms. SHY GUALLPA is a 81 y.o. female with history of CHF, HTN, tobacco use disorder-severe/dep, Cerebral aneurysm without rupture, previous TIA 03/2020, and multiple ED visits of similar presentation, presenting with dizziness and speech disturbance who was transferred from [facility] for MRI and consulted to neurology for evaluation of stroke. Daughter states that the two went to a store on 1/10 when patient felt dizzy and began to stutter in speech. This episode lasted 5-10 minutes, resolving after the store clerk gave the patient a brownie and water. Daughter reported that the patient had breakfast earlier that morning, then went shopping, returning home right after her episode around 1:30 PM, which was 3.5-6 hours after she ate breakfast. She had no other sx: no weakness, facial droop, incontinence, leaning to one side.    Pre-syncope with ? Orthostasis, less likely TIA Dizziness and dysarthria on standing from sitting x 2 in 3 weeks Code Stroke - No acute changes.  CTA head & neck- Unchanged- 81mm L MCA bifurcation aneurysm MRI  Unable to complete exam, limited exam but no acute infarct 2D Echo EF 60-65% LDL 53 HgbA1c 5.4 VTE prophylaxis - Lovenox aspirin 81 mg daily and clopidogrel 75 mg daily prior to admission, now on aspirin 81 mg daily and clopidogrel 75 mg daily.  Therapy recommendations:  pending Disposition:  pending  Hypertension ? Orthostatic hypotension Home meds:   Stable Orthostatic vital pending Long-term BP  goal normotensive Recommend TED hose if orthostatic hypotension  Hyperlipidemia Home meds:  Lipitor 40mg , resumed in hospital LDL 53, goal < 70 Continue statin at discharge  Tobacco abuse Current smoker Smoking cessation counseling provided Pt is willing to quit  Other Stroke Risk Factors Advanced Age >/= 61  Substance abuse - UDS:  THC POSITIVE, Patient advised to stop using due to stroke risk. CHF Coronary artery disease  Other Active Problems 77mm L MCA bifurcation aneurysm - unchanged - follow up as outpt Severe glaucoma - can see [facility] day # 0  Neurology will sign off. Please call with questions. Pt will follow up with stroke clinic NP at [facility] in about 4 weeks. Thanks for the consult.  [facility], MD PhD Stroke Neurology 03/15/2021 2:39 PM    To contact Stroke Continuity provider, please refer to 03/17/2021. After hours, contact General Neurology

## 2021-03-15 NOTE — Discharge Summary (Signed)
Physician Discharge Summary  Angela Mueller OQH:[id number] DOB: [birth year] DOA: 03/13/2021  PCP: Tenna Delaine, MD  Admit date: 03/13/2021 Discharge date: 03/15/2021  Admitted From: Home Discharge disposition: Home   Code Status: Full Code   Discharge Diagnosis:   Principal Problem:   Expressive aphasia Active Problems:   Dementia with behavioral disturbance   Dizziness   Hypokalemia   Essential hypertension   Glaucoma   Cerebral aneurysm without rupture    Chief Complaint  Patient presents with   Altered Mental Status   Brief narrative: Angela Mueller is a 81 y.o. female with PMH significant for CHF, HTN, tobacco use disorder, severe/dep, Cerebral aneurysm without rupture, previous TIA 03/2020, and multiple ED visits of similar presentation with dizziness and speech disturbance who lives at home with her daughter Patient presented to ED at [facility] on 1/11 with complaint of dizziness and changes in speech. Per history, patient and her daughter went to a store on 1/10 when patient felt dizzy and began to stutter in speech. This episode lasted 5-10 minutes, resolving after the store clerk gave the patient a brownie and water. Daughter reported that the patient had breakfast earlier that morning, then went shopping, returning home right after her episode around 1:30 PM, which was 3.5-6 hours after she ate breakfast. She had no other sx: no weakness, facial droop, incontinence, leaning to one side. In previous episodes, patient's imaging was without acute ischemia or hemorrhage.   In the ED patient is afebrile, heart rate less than 100, blood pressure mostly elevated to 170s. Labs with potassium low at 3.2 CT angio of head and neck showed unchanged 4 mm cerebral aneurysm without rupture with no evidence of stroke or large vessel occlusion. UDS positive for marijuana Admitted to hospitalist service Neurology consulted   Subjective: Patient was seen and  examined this morning.  Pleasant, elderly African-American female. Not in distress, but still has expressive aphasia. Chart reviewed Afebrile, heart rate in 50s this morning, blood pressure as high as 155/70 Labs with potassium low at 3.2. UDS positive for marijuana  Hospital course: TIA -Presented with dizziness, changes in speech. -Patient reportedly had multiple prior episodes of similar nature without corresponding finding on imaging. -Imagings at this time as well did not show any acute findings. -TIA work-up completed. -Echocardiogram with EF 60 to 65%. -A1c 5.4, LDL 53, HDL 49 -Neurology consult appreciated.  Prior to admission patient was on aspirin 81 mg daily and Plavix 75 mg daily.  Patient to continue the same. -Home with PT recommended.  Orthostatic syncope Essential hypertension -Per daughter, patient had dizziness when she stood up from a sitting position.  Orthostatic hypotension suspected. -Orthostatic vital signs measured in the hospital today shows a drop in blood pressure from 169/59 while lying down to 145/68 on standing up.  But patient did not have any symptoms with that. -Home meds include amlodipine 10 mg daily, HCTZ 12.5 mg daily losartan 100 mg daily -Currently home meds are on hold.   -I discussed with patient's daughter about this.  I would stop HCTZ at this point.  Continue others.  Dementia, with behavioral disturbance -Delirium precautions -Supportive care.  Cerebral aneurysm, without rupture:  -unchanged 4 mm left MCA aneurysm -Continue outpatient follow-up and surveillance   Marijuana use -Patient reportedly uses marijuana for glaucoma.  Unclear if marijuana is contributing to symptoms.  Hypokalemia -Potassium replacement Recent Labs  Lab 03/13/21 1745 03/14/21 1138  K 3.2* 3.4*  MG  --  2.0    Glaucoma -Continue home eyedrop   Mobility: Home health recommended by PT Living condition: Lives at home with daughter Goals of care:    Code Status: Full Code  Nutritional status: Body mass index is 23.38 kg/m.      Discharge Medications:   Allergies as of 03/15/2021       Reactions   Meclizine Swelling   Causes angioedema Causes angioedema Causes angioedema   Aspirin Hives   Pt takes at home.   Lisinopril Swelling   Gabapentin    Dizziness         Medication List     STOP taking these medications    hydrochlorothiazide 12.5 MG tablet Commonly known as: HYDRODIURIL       TAKE these medications    acetaminophen 500 MG tablet Commonly known as: TYLENOL Take 1,000 mg by mouth every 6 (six) hours as needed for pain.   amLODipine 10 MG tablet Commonly known as: NORVASC Take 10 mg by mouth daily.   aspirin 81 MG chewable tablet Chew 81 mg by mouth daily.   atorvastatin 40 MG tablet Commonly known as: LIPITOR Take 40 mg by mouth at bedtime.   Baclofen 5 MG Tabs Take 5 mg by mouth every 8 (eight) hours as needed (headache/neck tension).   CALCIUM PO Take 1 tablet by mouth daily.   Cholecalciferol 125 MCG (5000 UT) capsule Take 5,000 Units by mouth daily.   clopidogrel 75 MG tablet Commonly known as: PLAVIX Take 75 mg by mouth daily.   FeroSul 325 (65 FE) MG tablet Generic drug: ferrous sulfate Take 325 mg by mouth daily.   lactulose 10 GM/15ML solution Commonly known as: CHRONULAC Take 10 g by mouth 2 (two) times daily as needed for constipation.   latanoprost 0.005 % ophthalmic solution Commonly known as: XALATAN Place 1 drop into the left eye at bedtime.   losartan 100 MG tablet Commonly known as: COZAAR Take 100 mg by mouth daily.   multivitamin with minerals tablet Take 1 tablet by mouth daily.   PreserVision AREDS 2 Caps Take 2 capsules by mouth daily.   OMEGA-3 FATTY ACIDS PO Take 1 capsule by mouth daily.   pantoprazole 40 MG tablet Commonly known as: PROTONIX Take 40 mg by mouth daily.   PARoxetine 10 MG tablet Commonly known as: PAXIL Take 10 mg by mouth  daily.   Rhopressa 0.02 % Soln Generic drug: Netarsudil Dimesylate Place 1 drop into the right eye in the morning and at bedtime.   Stool Softener 100 MG capsule Generic drug: docusate sodium Take 100 mg by mouth 2 (two) times daily.        Wound care:     Discharge Instructions:   Discharge Instructions     Call MD for:  difficulty breathing, headache or visual disturbances   Complete by: As directed    Call MD for:  extreme fatigue   Complete by: As directed    Call MD for:  hives   Complete by: As directed    Call MD for:  persistant dizziness or light-headedness   Complete by: As directed    Call MD for:  persistant nausea and vomiting   Complete by: As directed    Call MD for:  severe uncontrolled pain   Complete by: As directed    Call MD for:  temperature >100.4   Complete by: As directed    Diet general   Complete by: As directed    Discharge instructions  Complete by: As directed    General discharge instructions:  Follow with Primary MD Tenna Delaine, MD in 7 days   Get CBC/BMP checked in next visit within 1 week by PCP or SNF MD. (We routinely change or add medications that can affect your baseline labs and fluid status, therefore we recommend that you get the mentioned basic workup next visit with your PCP, your PCP may decide not to get them or add new tests based on their clinical decision)  On your next visit with your PCP, please get your medicines reviewed and adjusted.  Please request your PCP  to go over all hospital tests, procedures, radiology results at the follow up, please get all Hospital records sent to your PCP by signing hospital release before you go home.  Activity: As tolerated with Full fall precautions use walker/cane & assistance as needed  Avoid using any recreational substances like cigarette, tobacco, alcohol, or non-prescribed drug.  If you experience worsening of your admission symptoms, develop shortness of breath,  life threatening emergency, suicidal or homicidal thoughts you must seek medical attention immediately by calling 911 or calling your MD immediately  if symptoms less severe.  You must read complete instructions/literature along with all the possible adverse reactions/side effects for all the medicines you take and that have been prescribed to you. Take any new medicine only after you have completely understood and accepted all the possible adverse reactions/side effects.   Do not drive, operate heavy machinery, perform activities at heights, swimming or participation in water activities or provide baby sitting services if your were admitted for syncope or siezures until you have seen by Primary MD or a Neurologist and advised to do so again.  Do not drive when taking Pain medications.  Do not take more than prescribed Pain, Sleep and Anxiety Medications  Wear Seat belts while driving.  Please note You were cared for by a hospitalist during your hospital stay. If you have any questions about your discharge medications or the care you received while you were in the hospital after you are discharged, you can call the unit and asked to speak with the hospitalist on call if the hospitalist that took care of you is not available. Once you are discharged, your primary care physician will handle any further medical issues. Please note that NO REFILLS for any discharge medications will be authorized once you are discharged, as it is imperative that you return to your primary care physician (or establish a relationship with a primary care physician if you do not have one) for your aftercare needs so that they can reassess your need for medications and monitor your lab values.   Increase activity slowly   Complete by: As directed        Follow ups:    Follow-up Information     Tenna Delaine, MD Follow up.   Specialty: Internal Medicine Contact information: [address] [place]  [address] [phone]                 Discharge Exam:   Vitals:   03/15/21 0757 03/15/21 1546 03/15/21 1548 03/15/21 1550  BP: (!) 155/70 (!) 165/59 (!) 157/75 (!) 145/68  Pulse: 65 (!) 55 61 71  Resp: 18 20    Temp: 98 F (36.7 C) 98.6 F (37 C)    TempSrc: Oral Oral    SpO2: 97%     Weight:      Height:  Body mass index is 23.38 kg/m.  General exam: Pleasant, elderly African-American female.  Stressed because of expressive aphasia.  Not in physical distress Skin: No rashes, lesions or ulcers. HEENT: Atraumatic, normocephalic, no obvious bleeding Lungs: Clear to auscultation bilaterally CVS: Regular rate and rhythm, no murmur GI/Abd soft, nontender, nondistended, bowels are present CNS: Alert, awake, significant expressive aphasia.  Able to follow motor commands Psychiatry: Sad affect Extremities: No pedal edema, no calf tenderness  Time coordinating discharge: 35 minutes   The results of significant diagnostics from this hospitalization (including imaging, microbiology, ancillary and laboratory) are listed below for reference.    Procedures and Diagnostic Studies:   CT ANGIO HEAD NECK W WO CM  Result Date: 03/13/2021 CLINICAL DATA:  Stroke/TIA, difficulty speaking for 2 days EXAM: CT ANGIOGRAPHY HEAD AND NECK TECHNIQUE: Multidetector CT imaging of the head and neck was performed using the standard protocol during bolus administration of intravenous contrast. Multiplanar CT image reconstructions and MIPs were obtained to evaluate the vascular anatomy. Carotid stenosis measurements (when applicable) are obtained utilizing NASCET criteria, using the distal internal carotid diameter as the denominator. RADIATION DOSE REDUCTION: This exam was performed according to the departmental dose-optimization program which includes automated exposure control, adjustment of the mA and/or kV according to patient size and/or use of iterative reconstruction technique.  CONTRAST:  80mL OMNIPAQUE IOHEXOL 350 MG/ML SOLN COMPARISON:  07/24/2020 CTA head neck, 08/23/2020 CT head FINDINGS: CT HEAD FINDINGS Brain: No evidence of acute infarction, hemorrhage, cerebral edema, mass, mass effect, or midline shift. No hydrocephalus or extra-axial fluid collection. Vascular: No hyperdense vessel. Skull: Normal. Negative for fracture or focal lesion. Sinuses/Orbits: No acute finding. Status post bilateral lens replacements. Other: The mastoid air cells are well aerated. CTA NECK FINDINGS Aortic arch: Standard branching. Imaged portion shows no evidence of aneurysm or dissection. No significant stenosis of the major arch vessel origins. Aortic atherosclerosis. Right carotid system: No evidence of dissection, stenosis (50% or greater) or occlusion. Calcifications at the bifurcation and in the proximal right ICA are not hemodynamically significant. Left carotid system: No evidence of dissection, stenosis (50% or greater) or occlusion. Vertebral arteries: No evidence of dissection, stenosis (50% or greater) or occlusion. Skeleton: Degenerative changes in the cervical spine. No acute osseous abnormality. Other neck: Redemonstrated enlargement of the thyroid gland, without significant interval change. Upper chest: Centrilobular and paraseptal emphysema. No focal pulmonary opacity. Review of the MIP images confirms the above findings CTA HEAD FINDINGS Anterior circulation: Both internal carotid arteries are patent to the termini, with mild calcifications but without significant stenosis. A1 segments patent. Normal anterior communicating artery. Anterior cerebral arteries are patent to their distal aspects. No M1 stenosis or occlusion. Unchanged 4 mm aneurysm at the left MCA bifurcation. Normal right MCA bifurcation. Distal MCA branches perfused and symmetric. Posterior circulation: Vertebral arteries patent to the vertebrobasilar junction without stenosis. Basilar patent to its distal aspect. Superior  cerebellar arteries patent bilaterally. Near fetal origin of the left PCA with a patent left posterior communicating artery and a diminutive left P1 segment. Normal origin of the right posterior cerebral artery. No right posterior communicating artery is visualized. PCAs perfused to their distal aspects without stenosis. Venous sinuses: As permitted by contrast timing, patent. Anatomic variants: None significant Review of the MIP images confirms the above findings IMPRESSION: 1.  No acute intracranial process. 2. 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 No new aneurysm, large vessel occlusion, or significant stenosis. 3.  No hemodynamically significant stenosis in the neck.  Electronically Signed   By: Wiliam Ke M.D.   On: 03/13/2021 20:06   MR BRAIN WO CONTRAST  Result Date: 03/14/2021 CLINICAL DATA:  Stroke, follow-up, sudden onset dizziness and slurred speech EXAM: MRI HEAD WITHOUT CONTRAST TECHNIQUE: Multiplanar, multiecho pulse sequences of the brain and surrounding structures were obtained without intravenous contrast. COMPARISON:  MRI 07/24/2020, correlation is also made with 03/13/2021 CTA head neck. FINDINGS: Evaluation is limited by motion and patient inability to cooperate and complete exam. Only diffusion-weighted, FLAIR, and susceptibility weighted sequences were obtained. No restricted diffusion to suggest acute or subacute infarct. No foci of hemosiderin deposition to suggest remote hemorrhage. Mineralization in the basal ganglia. Moderate T2 hyperintense signal in the periventricular white matter, likely the sequela of chronic small vessel ischemic disease. IMPRESSION: Limited evaluation, secondary to motion artifact and patient inability to complete the exam. Limited sequences obtained demonstrate no acute or subacute infarct. Electronically Signed   By: Wiliam Ke M.D.   On: 03/14/2021 19:47     Labs:   Basic Metabolic Panel: Recent Labs  Lab 03/13/21 1745 03/14/21 1138   NA 138 142  K 3.2* 3.4*  CL 104 107  CO2 25 25  GLUCOSE 90 89  BUN 13 7*  CREATININE 0.85 0.76  CALCIUM 9.4 9.1  MG  --  2.0   GFR Estimated Creatinine Clearance: 44.8 mL/min (by C-G formula based on SCr of 0.76 mg/dL). Liver Function Tests: Recent Labs  Lab 03/13/21 1745  AST 21  ALT 15  ALKPHOS 109  BILITOT 0.5  PROT 8.1  ALBUMIN 3.8   No results for input(s): LIPASE, AMYLASE in the last 168 hours. No results for input(s): AMMONIA in the last 168 hours. Coagulation profile Recent Labs  Lab 03/13/21 1745  INR 1.0    CBC: Recent Labs  Lab 03/13/21 1745  WBC 8.1  NEUTROABS 4.5  HGB 14.2  HCT 43.7  MCV 91.2  PLT 259   Cardiac Enzymes: No results for input(s): CKTOTAL, CKMB, CKMBINDEX, TROPONINI in the last 168 hours. BNP: Invalid input(s): POCBNP CBG: No results for input(s): GLUCAP in the last 168 hours. D-Dimer No results for input(s): DDIMER in the last 72 hours. Hgb A1c Recent Labs    03/14/21 1138  HGBA1C 5.4   Lipid Profile Recent Labs    03/14/21 1138  CHOL 107  HDL 49  LDLCALC 53  TRIG 25  CHOLHDL 2.2   Thyroid function studies Recent Labs    03/14/21 1148  TSH 0.859   Anemia work up No results for input(s): VITAMINB12, FOLATE, FERRITIN, TIBC, IRON, RETICCTPCT in the last 72 hours. Microbiology Recent Results (from the past 240 hour(s))  Resp Panel by RT-PCR (Flu A&B, Covid) Nasopharyngeal Swab     Status: None   Collection Time: 03/13/21  5:45 PM   Specimen: Nasopharyngeal Swab; Nasopharyngeal(NP) swabs in vial transport medium  Result Value Ref Range Status   SARS Coronavirus 2 by RT PCR NEGATIVE NEGATIVE Final    Comment: (NOTE) SARS-CoV-2 target nucleic acids are NOT DETECTED.  The SARS-CoV-2 RNA is generally detectable in upper respiratory specimens during the acute phase of infection. The lowest concentration of SARS-CoV-2 viral copies this assay can detect is 138 copies/mL. A negative result does not preclude  SARS-Cov-2 infection and should not be used as the sole basis for treatment or other patient management decisions. A negative result may occur with  improper specimen collection/handling, submission of specimen other than nasopharyngeal swab, presence of viral mutation(s) within the  areas targeted by this assay, and inadequate number of viral copies(<138 copies/mL). A negative result must be combined with clinical observations, patient history, and epidemiological information. The expected result is Negative.  Fact Sheet for Patients:  BloggerCourse.comhttps://www.fda.gov/media/152166/download  Fact Sheet for Healthcare Providers:  SeriousBroker.ithttps://www.fda.gov/media/152162/download  This test is no t yet approved or cleared by the [place] FDA and  has been authorized for detection and/or diagnosis of SARS-CoV-2 by FDA under an Emergency Use Authorization (EUA). This EUA will remain  in effect (meaning this test can be used) for the duration of the COVID-19 declaration under Section 564(b)(1) of the Act, 21 U.S.C.section 360bbb-3(b)(1), unless the authorization is terminated  or revoked sooner.       Influenza A by PCR NEGATIVE NEGATIVE Final   Influenza B by PCR NEGATIVE NEGATIVE Final    Comment: (NOTE) The Xpert Xpress SARS-CoV-2/FLU/RSV plus assay is intended as an aid in the diagnosis of influenza from Nasopharyngeal swab specimens and should not be used as a sole basis for treatment. Nasal washings and aspirates are unacceptable for Xpert Xpress SARS-CoV-2/FLU/RSV testing.  Fact Sheet for Patients: BloggerCourse.comhttps://www.fda.gov/media/152166/download  Fact Sheet for Healthcare Providers: SeriousBroker.ithttps://www.fda.gov/media/152162/download  This test is not yet approved or cleared by the [place] FDA and has been authorized for detection and/or diagnosis of SARS-CoV-2 by FDA under an Emergency Use Authorization (EUA). This EUA will remain in effect (meaning this test can be used) for the duration of  the COVID-19 declaration under Section 564(b)(1) of the Act, 21 U.S.C. section 360bbb-3(b)(1), unless the authorization is terminated or revoked.  Performed at [facility] [place], [address] [place], [place] [id number]      Signed: Lorin GlassBinaya Amandine Covino  [facility] 03/15/2021, 4:49 PM

## 2021-03-15 NOTE — Progress Notes (Signed)
Echocardiogram 2D Echocardiogram has been performed.  Angela Mueller 03/15/2021, 10:39 AM

## 2021-06-14 ENCOUNTER — Inpatient Hospital Stay (HOSPITAL_BASED_OUTPATIENT_CLINIC_OR_DEPARTMENT_OTHER)
Admission: EM | Admit: 2021-06-14 | Discharge: 2021-06-18 | DRG: 603 | Disposition: A | Discharge status: Home Health | Payer: Medicare Other | Attending: Internal Medicine | Admitting: Internal Medicine

## 2021-06-14 ENCOUNTER — Encounter (HOSPITAL_BASED_OUTPATIENT_CLINIC_OR_DEPARTMENT_OTHER): Payer: Self-pay

## 2021-06-14 ENCOUNTER — Other Ambulatory Visit: Payer: Self-pay

## 2021-06-14 ENCOUNTER — Emergency Department (HOSPITAL_BASED_OUTPATIENT_CLINIC_OR_DEPARTMENT_OTHER): Payer: Medicare Other

## 2021-06-14 DIAGNOSIS — I509 Heart failure, unspecified: Secondary | ICD-10-CM | POA: Diagnosis present

## 2021-06-14 DIAGNOSIS — L03113 Cellulitis of right upper limb: Secondary | ICD-10-CM

## 2021-06-14 DIAGNOSIS — Z7902 Long term (current) use of antithrombotics/antiplatelets: Secondary | ICD-10-CM

## 2021-06-14 DIAGNOSIS — L03011 Cellulitis of right finger: Principal | ICD-10-CM | POA: Diagnosis present

## 2021-06-14 DIAGNOSIS — F03918 Unspecified dementia, unspecified severity, with other behavioral disturbance: Secondary | ICD-10-CM | POA: Diagnosis present

## 2021-06-14 DIAGNOSIS — I10 Essential (primary) hypertension: Secondary | ICD-10-CM | POA: Diagnosis present

## 2021-06-14 DIAGNOSIS — M659 Synovitis and tenosynovitis, unspecified: Secondary | ICD-10-CM | POA: Diagnosis present

## 2021-06-14 DIAGNOSIS — M7989 Other specified soft tissue disorders: Secondary | ICD-10-CM | POA: Diagnosis present

## 2021-06-14 DIAGNOSIS — M65949 Unspecified synovitis and tenosynovitis, unspecified hand: Secondary | ICD-10-CM | POA: Diagnosis present

## 2021-06-14 DIAGNOSIS — I110 Hypertensive heart disease with heart failure: Secondary | ICD-10-CM | POA: Diagnosis present

## 2021-06-14 DIAGNOSIS — F17210 Nicotine dependence, cigarettes, uncomplicated: Secondary | ICD-10-CM | POA: Diagnosis present

## 2021-06-14 DIAGNOSIS — I671 Cerebral aneurysm, nonruptured: Secondary | ICD-10-CM | POA: Diagnosis not present

## 2021-06-14 DIAGNOSIS — Z79899 Other long term (current) drug therapy: Secondary | ICD-10-CM | POA: Diagnosis not present

## 2021-06-14 DIAGNOSIS — E785 Hyperlipidemia, unspecified: Secondary | ICD-10-CM | POA: Diagnosis present

## 2021-06-14 DIAGNOSIS — Z886 Allergy status to analgesic agent status: Secondary | ICD-10-CM | POA: Diagnosis not present

## 2021-06-14 DIAGNOSIS — Z8673 Personal history of transient ischemic attack (TIA), and cerebral infarction without residual deficits: Secondary | ICD-10-CM

## 2021-06-14 DIAGNOSIS — K219 Gastro-esophageal reflux disease without esophagitis: Secondary | ICD-10-CM | POA: Diagnosis present

## 2021-06-14 DIAGNOSIS — Z8679 Personal history of other diseases of the circulatory system: Secondary | ICD-10-CM | POA: Diagnosis not present

## 2021-06-14 DIAGNOSIS — Z5320 Procedure and treatment not carried out because of patient's decision for unspecified reasons: Secondary | ICD-10-CM | POA: Diagnosis present

## 2021-06-14 DIAGNOSIS — M65941 Unspecified synovitis and tenosynovitis, right hand: Secondary | ICD-10-CM | POA: Diagnosis present

## 2021-06-14 DIAGNOSIS — E876 Hypokalemia: Secondary | ICD-10-CM | POA: Diagnosis present

## 2021-06-14 DIAGNOSIS — Z7982 Long term (current) use of aspirin: Secondary | ICD-10-CM | POA: Diagnosis not present

## 2021-06-14 DIAGNOSIS — Z720 Tobacco use: Secondary | ICD-10-CM | POA: Diagnosis present

## 2021-06-14 DIAGNOSIS — Z90710 Acquired absence of both cervix and uterus: Secondary | ICD-10-CM | POA: Diagnosis not present

## 2021-06-14 DIAGNOSIS — Z888 Allergy status to other drugs, medicaments and biological substances status: Secondary | ICD-10-CM | POA: Diagnosis not present

## 2021-06-14 LAB — CBC WITH DIFFERENTIAL/PLATELET
Abs Immature Granulocytes: 0.04 10*3/uL (ref 0.00–0.07)
Basophils Absolute: 0.1 10*3/uL (ref 0.0–0.1)
Basophils Relative: 1 %
Eosinophils Absolute: 0.1 10*3/uL (ref 0.0–0.5)
Eosinophils Relative: 1 %
HCT: 38.4 % (ref 36.0–46.0)
Hemoglobin: 13.1 g/dL (ref 12.0–15.0)
Immature Granulocytes: 0 %
Lymphocytes Relative: 13 %
Lymphs Abs: 1.6 10*3/uL (ref 0.7–4.0)
MCH: 30 pg (ref 26.0–34.0)
MCHC: 34.1 g/dL (ref 30.0–36.0)
MCV: 87.9 fL (ref 80.0–100.0)
Monocytes Absolute: 1.8 10*3/uL — ABNORMAL HIGH (ref 0.1–1.0)
Monocytes Relative: 15 %
Neutro Abs: 8.5 10*3/uL — ABNORMAL HIGH (ref 1.7–7.7)
Neutrophils Relative %: 70 %
Platelets: 228 10*3/uL (ref 150–400)
RBC: 4.37 MIL/uL (ref 3.87–5.11)
RDW: 13.2 % (ref 11.5–15.5)
WBC: 12 10*3/uL — ABNORMAL HIGH (ref 4.0–10.5)
nRBC: 0 % (ref 0.0–0.2)

## 2021-06-14 LAB — BASIC METABOLIC PANEL
Anion gap: 11 (ref 5–15)
BUN: 11 mg/dL (ref 8–23)
CO2: 23 mmol/L (ref 22–32)
Calcium: 8.9 mg/dL (ref 8.9–10.3)
Chloride: 104 mmol/L (ref 98–111)
Creatinine, Ser: 0.84 mg/dL (ref 0.44–1.00)
GFR, Estimated: >60 mL/min (ref >60)
Glucose, Bld: 105 mg/dL — ABNORMAL HIGH (ref 70–99)
Potassium: 3.1 mmol/L — ABNORMAL LOW (ref 3.5–5.1)
Sodium: 138 mmol/L (ref 135–145)

## 2021-06-14 MED ORDER — ACETAMINOPHEN 500 MG PO TABS
1000.0000 mg | ORAL_TABLET | Freq: Once | ORAL | Status: AC
  Administered 2021-06-14: 1000 mg via ORAL
  Filled 2021-06-14: qty 2

## 2021-06-14 MED ORDER — POTASSIUM CHLORIDE CRYS ER 20 MEQ PO TBCR
40.0000 meq | EXTENDED_RELEASE_TABLET | ORAL | Status: AC
  Administered 2021-06-14 (×2): 40 meq via ORAL
  Filled 2021-06-14 (×2): qty 2

## 2021-06-14 MED ORDER — AMLODIPINE BESYLATE 10 MG PO TABS
10.0000 mg | ORAL_TABLET | Freq: Every day | ORAL | Status: DC
Start: 2021-06-15 — End: 2021-06-15

## 2021-06-14 MED ORDER — VANCOMYCIN HCL IN DEXTROSE 1-5 GM/200ML-% IV SOLN
1000.0000 mg | Freq: Once | INTRAVENOUS | Status: AC
  Administered 2021-06-14: 1000 mg via INTRAVENOUS
  Filled 2021-06-14: qty 200

## 2021-06-14 MED ORDER — VANCOMYCIN HCL 750 MG/150ML IV SOLN
750.0000 mg | INTRAVENOUS | Status: DC
  Administered 2021-06-15 – 2021-06-17 (×3): 750 mg via INTRAVENOUS
  Filled 2021-06-14 (×7): qty 150

## 2021-06-14 MED ORDER — HYDRALAZINE HCL 20 MG/ML IJ SOLN: 10.0000 mg | INTRAMUSCULAR | Status: DC | PRN

## 2021-06-14 MED ORDER — ATORVASTATIN CALCIUM 40 MG PO TABS
40.0000 mg | ORAL_TABLET | Freq: Every day | ORAL | Status: DC
  Filled 2021-06-14: qty 1

## 2021-06-14 MED ORDER — SODIUM CHLORIDE 0.9 % IV SOLN
2.0000 g | Freq: Once | INTRAVENOUS | Status: AC
  Administered 2021-06-15: 2 g via INTRAVENOUS
  Filled 2021-06-14: qty 12.5

## 2021-06-14 MED ORDER — LOSARTAN POTASSIUM 50 MG PO TABS: 100.0000 mg | ORAL_TABLET | Freq: Every day | ORAL | Status: DC

## 2021-06-14 NOTE — ED Notes (Signed)
XR at bedside

## 2021-06-14 NOTE — ED Notes (Signed)
Called Dr. Alda Berthold office  ?

## 2021-06-14 NOTE — ED Notes (Signed)
Report called to [place] receiving nurse at [facility]  ?

## 2021-06-14 NOTE — ED Notes (Signed)
Darcus Pester, daughter would like to be called with room assignment.[phone] ?

## 2021-06-14 NOTE — H&P (Signed)
?History and Physical  ? ? ?Angela Mueller CZY:[id number] DOB: 02-26-1941 DOA: 06/14/2021 ? ?PCP: Tenna Delaine, MD  ?Patient coming from: Home. ? ?Chief Complaint: Right index finger swelling. ? ?HPI: Angela Mueller is a 81 y.o. female with history of TIA, hypertension, cerebral aneurysm was brought to the ER after patient had persistent and worsening swelling of the right index finger.  Patient states that the swelling started after she clipped the nail on the finger about 2 weeks ago.  This progressively got worse.  At this point she is not able to flex the finger.  Denies any fever chills.  Denies any insect bites. ? ?ED Course: In the ER x-rays shows features concerning for cellulitis of the right index finger.  Dr. Yehuda Budd on-call hand surgeon has been consulted and patient was started on empiric antibiotics admitted for further work-up. ? ?Review of Systems: As per HPI, rest all negative. ? ? ?Past Medical History:  ?Diagnosis Date  ? Arthritis   ? CHF (congestive heart failure) (HCC)   ? GERD (gastroesophageal reflux disease)   ? Hypertension   ? Renal disorder   ? TIA (transient ischemic attack)   ? ? ?Past Surgical History:  ?Procedure Laterality Date  ? ABDOMINAL HYSTERECTOMY    ? ? ? reports that she has been smoking cigarettes. She has been smoking an average of 1 pack per day. She has never used smokeless tobacco. She reports current drug use. Drug: Marijuana. She reports that she does not drink alcohol. ? ?Allergies  ?Allergen Reactions  ? Meclizine Swelling  ?  Causes angioedema ?Causes angioedema ?Causes angioedema ?  ? Aspirin Hives  ?  Pt takes at home.  ? Lisinopril Swelling  ? Gabapentin   ?  Dizziness   ? ? ?History reviewed. No pertinent family history. ? ?Prior to Admission medications   ?Medication Sig Start Date End Date Taking? Authorizing Provider  ?acetaminophen (TYLENOL) 500 MG tablet Take 1,000 mg by mouth every 6 (six) hours as needed for pain.    [provider]   ?amLODipine (NORVASC) 10 MG tablet Take 10 mg by mouth daily. 03/08/20   [provider]  ?aspirin 81 MG chewable tablet Chew 81 mg by mouth daily. 03/26/20   [provider]  ?atorvastatin (LIPITOR) 40 MG tablet Take 40 mg by mouth at bedtime. 02/17/20   [provider]  ?Baclofen 5 MG TABS Take 5 mg by mouth every 8 (eight) hours as needed (headache/neck tension). 02/22/20   [provider]  ?CALCIUM PO Take 1 tablet by mouth daily.    [provider]  ?Cholecalciferol 125 MCG (5000 UT) capsule Take 5,000 Units by mouth daily.    [provider]  ?clopidogrel (PLAVIX) 75 MG tablet Take 75 mg by mouth daily. 03/13/20   [provider]  ?FEROSUL 325 (65 Fe) MG tablet Take 325 mg by mouth daily. 11/08/20   [provider]  ?lactulose (CHRONULAC) 10 GM/15ML solution Take 10 g by mouth 2 (two) times daily as needed for constipation. 11/19/20   [provider]  ?latanoprost (XALATAN) 0.005 % ophthalmic solution Place 1 drop into the left eye at bedtime. 12/27/19   [provider]  ?losartan (COZAAR) 100 MG tablet Take 100 mg by mouth daily.    [provider]  ?Multiple Vitamins-Minerals (MULTIVITAMIN WITH MINERALS) tablet Take 1 tablet by mouth daily.    [provider]  ?Multiple Vitamins-Minerals (PRESERVISION AREDS 2) CAPS Take 2 capsules by  mouth daily.    [provider]  ?Netarsudil Dimesylate (RHOPRESSA) 0.02 % SOLN Place 1 drop into the right eye in the morning and at bedtime. 12/17/20   [provider]  ?OMEGA-3 FATTY ACIDS PO Take 1 capsule by mouth daily.    [provider]  ?pantoprazole (PROTONIX) 40 MG tablet Take 40 mg by mouth daily. 02/17/21   [provider]  ?PARoxetine (PAXIL) 10 MG tablet Take 10 mg by mouth daily.    [provider]  ?STOOL SOFTENER 100 MG capsule Take 100 mg by mouth 2 (two) times daily. 11/19/20   [provider]   ? ? ?Physical Exam: ?Constitutional: Moderately built and nourished. ?Vitals:  ? 06/14/21 1700 06/14/21 1800 06/14/21 2024 06/14/21 2144  ?BP: (!) 141/65 (!) 142/68 (!) 143/67 (!) 148/65  ?Pulse: 71 70 66 65  ?Resp: 20 18 20 19   ?Temp:   98.2 ?F (36.8 ?C) 97.9 ?F (36.6 ?C)  ?TempSrc:   Oral Oral  ?SpO2: 96% 100% 98% 98%  ?Weight:      ?Height:      ? ?Eyes: Anicteric no pallor. ?ENMT: No discharge from the ears eyes nose and mouth. ?Neck: No mass felt.  No neck rigidity. ?Respiratory: No rhonchi or crepitations. ?Cardiovascular: S1-S2 heard. ?Abdomen: Soft nontender bowel sound present. ?Musculoskeletal: Swelling of the right index finger throughout the whole right index finger.  Unable to make a fist.  Patient's mobility of the right wrist is not affected. ?Skin: Mild erythema of the right index finger also the right hand.   ?Neurologic: Alert awake oriented time place and person.  Moving all extremities. ?Psychiatric: Oriented to time place and person. ? ? ?Labs on Admission: I have personally reviewed following labs and imaging studies ? ?CBC: ?Recent Labs  ?Lab 06/14/21 ?1540  ?WBC 12.0*  ?NEUTROABS 8.5*  ?HGB 13.1  ?HCT 38.4  ?MCV 87.9  ?PLT 228  ? ?Basic Metabolic Panel: ?Recent Labs  ?Lab 06/14/21 ?1540  ?NA 138  ?K 3.1*  ?CL 104  ?CO2 23  ?GLUCOSE 105*  ?BUN 11  ?CREATININE 0.84  ?CALCIUM 8.9  ? ?GFR: ?Estimated Creatinine Clearance: 43.7 mL/min (by C-G formula based on SCr of 0.84 mg/dL). ?Liver Function Tests: ?No results for input(s): AST, ALT, ALKPHOS, BILITOT, PROT, ALBUMIN in the last 168 hours. ?No results for input(s): LIPASE, AMYLASE in the last 168 hours. ?No results for input(s): AMMONIA in the last 168 hours. ?Coagulation Profile: ?No results for input(s): INR, PROTIME in the last 168 hours. ?Cardiac Enzymes: ?No results for input(s): CKTOTAL, CKMB, CKMBINDEX, TROPONINI in the last 168 hours. ?BNP (last 3 results) ?No results for input(s): PROBNP in the last 8760 hours. ?HbA1C: ?No results for  input(s): HGBA1C in the last 72 hours. ?CBG: ?No results for input(s): GLUCAP in the last 168 hours. ?Lipid Profile: ?No results for input(s): CHOL, HDL, LDLCALC, TRIG, CHOLHDL, LDLDIRECT in the last 72 hours. ?Thyroid Function Tests: ?No results for input(s): TSH, T4TOTAL, FREET4, T3FREE, THYROIDAB in the last 72 hours. ?Anemia Panel: ?No results for input(s): VITAMINB12, FOLATE, FERRITIN, TIBC, IRON, RETICCTPCT in the last 72 hours. ?Urine analysis: ?   ?Component Value Date/Time  ? COLORURINE YELLOW 03/13/2021 1745  ? APPEARANCEUR HAZY (A) 03/13/2021 1745  ? LABSPEC 1.025 03/13/2021 1745  ? PHURINE 5.5 03/13/2021 1745  ? GLUCOSEU NEGATIVE 03/13/2021 1745  ? HGBUR NEGATIVE 03/13/2021 1745  ? BILIRUBINUR NEGATIVE 03/13/2021 1745  ? KETONESUR NEGATIVE 03/13/2021 1745  ? PROTEINUR NEGATIVE 03/13/2021 1745  ?  UROBILINOGEN 1.0 02/24/2014 1433  ? NITRITE NEGATIVE 03/13/2021 1745  ? LEUKOCYTESUR TRACE (A) 03/13/2021 1745  ? ?Sepsis Labs: ?@LABRCNTIP (procalcitonin:4,lacticidven:4) ?)No results found for this or any previous visit (from the past 240 hour(s)).  ? ?Radiological Exams on Admission: ?DG Hand Complete Right ? ?Result Date: 06/14/2021 ?CLINICAL DATA:  Swelling of the second digit. EXAM: RIGHT HAND - COMPLETE 3+ VIEW COMPARISON:  None. FINDINGS: No acute fracture or dislocation. No aggressive osseous lesion. Normal alignment. Mild osteoarthritis of first CMC joint. Severe soft tissue swelling of the second digit concerning for cellulitis. No radiopaque foreign body or soft tissue emphysema. IMPRESSION: 1. Severe soft tissue swelling of the second digit concerning for cellulitis. Electronically Signed   By: Elige KoHetal  Patel M.D.   On: 06/14/2021 15:40   ? ? ? ?Assessment/Plan ?Principal Problem: ?  Tenosynovitis of right hand ?Active Problems: ?  Essential hypertension ?  Cerebral aneurysm without rupture ?  Tenosynovitis of finger ?  ? ?Tenosynovitis of the right hand involving the right index finger with cellulitis  for which patient is on empiric antibiotics.  Dr. Yehuda BuddSpears on-call hand surgeon has been consulted.  We will keep patient n.p.o. past midnight until seen by hand surgeon. ?History of hypertension -patient states she is not t

## 2021-06-14 NOTE — Progress Notes (Signed)
Pharmacy Antibiotic Note ? ?Angela Mueller is a 81 y.o. female admitted on 06/14/2021 with  wound infection .  Pharmacy has been consulted for vancomycin dosing. ? ?WBC mildly elevated. SCr wnl  ? ?Plan: ?-Vancomycin 1 gm in the ED followed by Vancomycin 750 mg IV Q 24 hrs. Goal AUC 400-550. ?Expected AUC: 452 ?SCr used: 0.84 ?-Monitor CBC, renal fx, cultures and clinical progress ?-Vanc levels as indicated  ? ? ?Height: 5\' 2"  (157.5 cm) ?Weight: 61.2 kg (135 lb) ?IBW/kg (Calculated) : 50.1 ? ?Temp (24hrs), Avg:98.3 ?F (36.8 ?C), Min:98.3 ?F (36.8 ?C), Max:98.3 ?F (36.8 ?C) ? ?Recent Labs  ?Lab 06/14/21 ?1540  ?WBC 12.0*  ?CREATININE 0.84  ?  ?Estimated Creatinine Clearance: 43.7 mL/min (by C-G formula based on SCr of 0.84 mg/dL).   ? ?Allergies  ?Allergen Reactions  ? Meclizine Swelling  ?  Causes angioedema ?Causes angioedema ?Causes angioedema ?  ? Aspirin Hives  ?  Pt takes at home.  ? Lisinopril Swelling  ? Gabapentin   ?  Dizziness   ? ? ?Antimicrobials this admission: ?Vancomycin 4/14 >>  ? ?Dose adjustments this admission: ? ? ?Microbiology results: ?  ? ?Thank you for allowing pharmacy to be a part of this patient?s care. ? ?5/14, PharmD., BCCCP ?Clinical Pharmacist ?Please refer to [facility] for unit-specific pharmacist  ? ? ?

## 2021-06-14 NOTE — ED Notes (Signed)
Report given to [facility] from [place]  ?

## 2021-06-14 NOTE — ED Notes (Signed)
Called daughter for bed update at [facility] per pt request. ?

## 2021-06-14 NOTE — ED Triage Notes (Signed)
Pt arrives with significant swelling to right index finger with no drainage Pt states it started after trimming her nails.  ?

## 2021-06-14 NOTE — ED Provider Notes (Signed)
?[facility] EMERGENCY DEPARTMENT ?Provider Note ? ? ?CSN: [id number] ?Arrival date & time: 06/14/21  1421 ? ?  ? ?History ? ?Chief Complaint  ?Patient presents with  ? Finger Injury  ? Wound Check  ? ? ?Angela Mueller is a 81 y.o. female. ? ?Patient is an 81 year old female with a history of hypertension, hyperlipidemia, CHF, renal disorder and prior TIA who reports she is not taking any medications right now except for eyedrops and presenting today with pain and swelling of her right index finger.  She reports symptoms started about 10 days ago and she is putting Betadine on the area but it continues to get worse.  Her sister saw her finger this morning and told her she needed to go to the hospital.  Patient has noted worsening swelling, blistering of the finger that is now involve the entire finger to where she cannot bend it.  She also notes swelling and redness of her right hand that goes all the way up to her elbow.  She reports she has not had a lot of energy and she has been tired but does not think she has been running a fever. ? ?The history is provided by the patient.  ?Wound Check ? ? ?  ? ?Home Medications ?Prior to Admission medications   ?Medication Sig Start Date End Date Taking? Authorizing Provider  ?acetaminophen (TYLENOL) 500 MG tablet Take 1,000 mg by mouth every 6 (six) hours as needed for pain.    [provider]  ?amLODipine (NORVASC) 10 MG tablet Take 10 mg by mouth daily. 03/08/20   [provider]  ?aspirin 81 MG chewable tablet Chew 81 mg by mouth daily. 03/26/20   [provider]  ?atorvastatin (LIPITOR) 40 MG tablet Take 40 mg by mouth at bedtime. 02/17/20   [provider]  ?Baclofen 5 MG TABS Take 5 mg by mouth every 8 (eight) hours as needed (headache/neck tension). 02/22/20   [provider]  ?CALCIUM PO Take 1 tablet by mouth daily.    [provider]  ?Cholecalciferol 125 MCG (5000 UT) capsule Take 5,000 Units by mouth  daily.    [provider]  ?clopidogrel (PLAVIX) 75 MG tablet Take 75 mg by mouth daily. 03/13/20   [provider]  ?FEROSUL 325 (65 Fe) MG tablet Take 325 mg by mouth daily. 11/08/20   [provider]  ?lactulose (CHRONULAC) 10 GM/15ML solution Take 10 g by mouth 2 (two) times daily as needed for constipation. 11/19/20   [provider]  ?latanoprost (XALATAN) 0.005 % ophthalmic solution Place 1 drop into the left eye at bedtime. 12/27/19   [provider]  ?losartan (COZAAR) 100 MG tablet Take 100 mg by mouth daily.    [provider]  ?Multiple Vitamins-Minerals (MULTIVITAMIN WITH MINERALS) tablet Take 1 tablet by mouth daily.    [provider]  ?Multiple Vitamins-Minerals (PRESERVISION AREDS 2) CAPS Take 2 capsules by mouth daily.    [provider]  ?Netarsudil Dimesylate (RHOPRESSA) 0.02 % SOLN Place 1 drop into the right eye in the morning and at bedtime. 12/17/20   [provider]  ?OMEGA-3 FATTY ACIDS PO Take 1 capsule by mouth daily.    [provider]  ?pantoprazole (PROTONIX) 40 MG tablet Take 40 mg by mouth daily. 02/17/21   [provider]  ?PARoxetine (PAXIL) 10 MG tablet Take 10 mg by mouth daily.    [provider]  ?STOOL SOFTENER 100 MG capsule Take  100 mg by mouth 2 (two) times daily. 11/19/20   [provider]  ?   ? ?Allergies    ?Meclizine, Aspirin, Lisinopril, and Gabapentin   ? ?Review of Systems   ?Review of Systems ? ?Physical Exam ?Updated Vital Signs ?BP (!) 152/67   Pulse 69   Temp 98.3 ?F (36.8 ?C) (Oral)   Resp 17   Ht 5\' 2"  (1.575 m)   Wt 61.2 kg   SpO2 100%   BMI 24.69 kg/m?  ?Physical Exam ?Vitals and nursing note reviewed.  ?Constitutional:   ?   General: She is not in acute distress. ?   Appearance: She is well-developed.  ?HENT:  ?   Head: Normocephalic and atraumatic.  ?Eyes:  ?   Conjunctiva/sclera: Conjunctivae normal.  ?   Pupils: Pupils are equal, round,  and reactive to light.  ?Cardiovascular:  ?   Rate and Rhythm: Normal rate and regular rhythm.  ?   Heart sounds: No murmur heard. ?Pulmonary:  ?   Effort: Pulmonary effort is normal. No respiratory distress.  ?   Breath sounds: Normal breath sounds. No wheezing or rales.  ?Abdominal:  ?   General: There is no distension.  ?   Palpations: Abdomen is soft.  ?   Tenderness: There is no abdominal tenderness. There is no guarding or rebound.  ?Musculoskeletal:     ?   General: Tenderness present. Normal range of motion.  ?     Hands: ? ?   Cervical back: Normal range of motion and neck supple.  ?   Comments: The entire right hand is erythematous and erythema tracks up to the elbow along with swelling in the forearm  ?Skin: ?   General: Skin is warm and dry.  ?   Findings: No erythema or rash.  ?Neurological:  ?   Mental Status: She is alert and oriented to person, place, and time. Mental status is at baseline.  ?Psychiatric:     ?   Mood and Affect: Mood normal.     ?   Behavior: Behavior normal.  ? ? ? ? ? ?ED Results / Procedures / Treatments   ?Labs ?(all labs ordered are listed, but only abnormal results are displayed) ?Labs Reviewed  ?CBC WITH DIFFERENTIAL/PLATELET - Abnormal; Notable for the following components:  ?    Result Value  ? WBC 12.0 (*)   ? Neutro Abs 8.5 (*)   ? Monocytes Absolute 1.8 (*)   ? All other components within normal limits  ?BASIC METABOLIC PANEL - Abnormal; Notable for the following components:  ? Potassium 3.1 (*)   ? Glucose, Bld 105 (*)   ? All other components within normal limits  ? ? ?EKG ?None ? ?Radiology ?DG Hand Complete Right ? ?Result Date: 06/14/2021 ?CLINICAL DATA:  Swelling of the second digit. EXAM: RIGHT HAND - COMPLETE 3+ VIEW COMPARISON:  None. FINDINGS: No acute fracture or dislocation. No aggressive osseous lesion. Normal alignment. Mild osteoarthritis of first CMC joint. Severe soft tissue swelling of the second digit concerning for cellulitis. No radiopaque foreign  body or soft tissue emphysema. IMPRESSION: 1. Severe soft tissue swelling of the second digit concerning for cellulitis. Electronically Signed   By: Elige KoHetal  Patel M.D.   On: 06/14/2021 15:40   ? ?Procedures ?Procedures  ? ? ?Medications Ordered in ED ?Medications  ?vancomycin (VANCOCIN) IVPB 1000 mg/200 mL premix (1,000 mg Intravenous New Bag/Given 06/14/21 1606)  ?acetaminophen (TYLENOL) tablet 1,000 mg (1,000 mg Oral Given  06/14/21 1601)  ? ? ?ED Course/ Medical Decision Making/ A&P ?  ?                        ?Medical Decision Making ?Amount and/or Complexity of Data Reviewed ?External Data Reviewed: notes. ?Labs: ordered. Decision-making details documented in ED Course. ?Radiology: ordered and independent interpretation performed. Decision-making details documented in ED Course. ? ?Risk ?OTC drugs. ?Prescription drug management. ?Drug therapy requiring intensive monitoring for toxicity. ?Decision regarding hospitalization. ? ? ?Elderly female presenting today with findings consistent with right index finger flexor tenosynovitis with surrounding cellulitis that tracks all the way up to the elbow.  Patient cannot recall any trauma and thinks it started after she was trimming her nails.  Patient does not display evidence of sepsis.  She has not been on any antibiotics.  She has not noted any drainage of the finger.  Discussed with patient the extent of the infection and concern.  Also patient will need specialist evaluation.  Patient denies any allergies to any antibiotics.  We will start patient on vancomycin.  Will discuss with hand surgery.  Will admit to the hospitalist service.  Labs and imaging are pending. ? ?4:21 PM ?I independently interpreted patient's labs which shows a white count of 12,000.  I independently visualized and interpreted patient's x-ray which shows no evidence of bony abnormality.  Radiology reports significant soft tissue swelling of the index finger.  Consulted Dr. Yehuda Budd with hand  surgery. ?Also spoke with hospitalist for admission. ? ? ? ? ? ? ? ?Final Clinical Impression(s) / ED Diagnoses ?Final diagnoses:  ?Tenosynovitis of finger  ?Cellulitis of right upper extremity  ? ? ?Rx / DC Orders ?ED D

## 2021-06-15 ENCOUNTER — Inpatient Hospital Stay (HOSPITAL_COMMUNITY): Payer: Medicare Other

## 2021-06-15 DIAGNOSIS — M659 Synovitis and tenosynovitis, unspecified: Secondary | ICD-10-CM | POA: Diagnosis not present

## 2021-06-15 DIAGNOSIS — Z720 Tobacco use: Secondary | ICD-10-CM | POA: Diagnosis present

## 2021-06-15 LAB — CBC
HCT: 37.4 % (ref 36.0–46.0)
Hemoglobin: 12.4 g/dL (ref 12.0–15.0)
MCH: 29 pg (ref 26.0–34.0)
MCHC: 33.2 g/dL (ref 30.0–36.0)
MCV: 87.6 fL (ref 80.0–100.0)
Platelets: 206 10*3/uL (ref 150–400)
RBC: 4.27 MIL/uL (ref 3.87–5.11)
RDW: 13.2 % (ref 11.5–15.5)
WBC: 10.7 10*3/uL — ABNORMAL HIGH (ref 4.0–10.5)
nRBC: 0 % (ref 0.0–0.2)

## 2021-06-15 LAB — BASIC METABOLIC PANEL
Anion gap: 4 — ABNORMAL LOW (ref 5–15)
BUN: 7 mg/dL — ABNORMAL LOW (ref 8–23)
CO2: 24 mmol/L (ref 22–32)
Calcium: 8.7 mg/dL — ABNORMAL LOW (ref 8.9–10.3)
Chloride: 111 mmol/L (ref 98–111)
Creatinine, Ser: 0.67 mg/dL (ref 0.44–1.00)
GFR, Estimated: >60 mL/min (ref >60)
Glucose, Bld: 101 mg/dL — ABNORMAL HIGH (ref 70–99)
Potassium: 3.6 mmol/L (ref 3.5–5.1)
Sodium: 139 mmol/L (ref 135–145)

## 2021-06-15 MED ORDER — ATORVASTATIN CALCIUM 40 MG PO TABS
40.0000 mg | ORAL_TABLET | Freq: Every day | ORAL | Status: DC
Start: 2021-06-15 — End: 2021-06-18
  Administered 2021-06-16 – 2021-06-18 (×3): 40 mg via ORAL
  Filled 2021-06-15 (×4): qty 1

## 2021-06-15 MED ORDER — NICOTINE 14 MG/24HR TD PT24
14.0000 mg | MEDICATED_PATCH | Freq: Every day | TRANSDERMAL | Status: DC
  Administered 2021-06-16 – 2021-06-18 (×3): 14 mg via TRANSDERMAL
  Filled 2021-06-15 (×4): qty 1

## 2021-06-15 MED ORDER — HALOPERIDOL LACTATE 5 MG/ML IJ SOLN
5.0000 mg | Freq: Four times a day (QID) | INTRAMUSCULAR | Status: DC | PRN
Start: 2021-06-15 — End: 2021-06-18
  Administered 2021-06-15: 5 mg via INTRAMUSCULAR
  Filled 2021-06-15 (×2): qty 1

## 2021-06-15 MED ORDER — QUETIAPINE FUMARATE 50 MG PO TABS
50.0000 mg | ORAL_TABLET | Freq: Every day | ORAL | Status: DC
  Administered 2021-06-15 – 2021-06-17 (×3): 50 mg via ORAL
  Filled 2021-06-15 (×3): qty 1

## 2021-06-15 MED ORDER — HALOPERIDOL LACTATE 5 MG/ML IJ SOLN
5.0000 mg | Freq: Once | INTRAMUSCULAR | Status: AC
  Administered 2021-06-15 (×2): 5 mg via INTRAMUSCULAR
  Filled 2021-06-15: qty 1

## 2021-06-15 MED ORDER — SODIUM CHLORIDE 0.9 % IV SOLN
2.0000 g | Freq: Two times a day (BID) | INTRAVENOUS | Status: DC
  Administered 2021-06-15 – 2021-06-18 (×7): 2 g via INTRAVENOUS
  Filled 2021-06-15 (×8): qty 12.5

## 2021-06-15 MED ORDER — LORAZEPAM 2 MG/ML IJ SOLN
0.5000 mg | Freq: Once | INTRAMUSCULAR | Status: AC
  Administered 2021-06-15: 0.5 mg via INTRAVENOUS
  Filled 2021-06-15: qty 1

## 2021-06-15 MED ORDER — AMLODIPINE BESYLATE 5 MG PO TABS
5.0000 mg | ORAL_TABLET | Freq: Every day | ORAL | Status: DC
  Administered 2021-06-16 – 2021-06-18 (×3): 5 mg via ORAL
  Filled 2021-06-15 (×4): qty 1

## 2021-06-15 NOTE — Consult Note (Signed)
?Orthopedic Hand Surgery Consultation: ? ?Reason for Consult: Right index finger infection ?Referring Physician: Dr. Toniann FailKakrakandy ? ? ?HPI: Angela Mueller is a(an) 81 y.o. female who presents with somewhere between 5 to 14 days onset of right index finger pain and swelling.  This is gotten quite significantly swollen and has been present like this for at least a week.  She was admitted to the hospital for IV antibiotics.  She has some pain with active range of motion and significant mount of fluctuance in this area.  The pain is worse with activity and improved with rest.  She has no systemic symptoms.  Patient does not recall exactly how this happened however there is concerned that it was possibly from when she clipped her fingernail 2 weeks ago.  Patient is a cigarette smoker. ? ? ?Physical Exam: ?Right upper Extremity ?Significant swelling and fluctuance present over the right index finger with blistering present this does not palpate deep and is quite superficial with purulence from the base of the index finger out to the tip of the finger circumferentially.  The dorsal ulnar surface of the index finger does not have this blister otherwise wraps all the way around the finger.  She is able to weakly flex and extend the digit actively limited due to pain.  Sensation is altered to light touch.  The digit is warm however unable to assess capillary refill due to blistering.  The nail plate remains intact ? ? ?Assessment/Plan: ?Right index finger infection, chronic 1 to 2 weeks of onset. ? ?The patient is admitted on IV antibiotics recommend continuing.  Based on exam she clearly has purulence beneath the skin however it is very superficial.  I used an 18-gauge needle after prepped with an alcohol pad just at the base and just punctured the dorsal aspect of the blister and allowed for the entire blister to completely drained out of the finger.  There was probably 10 cc of purulent material that came out.  This  thoroughly decompressed the digit.  It is possible that she has a deep infection or involvement of the flexor tendon sheath however her pain does not palpate proximal to the base of the digit and she has had good relief after this aspiration/drainage.  Recommend CT scan to assess for deep infection or involvement of the tendon sheath with this chronic nature is difficult to determine whether or not a formal surgical procedure will be needed.  Ok for diet today. Will keep NPO at midnight.  ? ? ? ?J. Standley Dakinseid Shyanne Mcclary, MD ?Orthopaedic Hand Surgeon ?[facility] ?Office number: [phone] ?[address] ?[place], [place] [address] ? ? ? ?Past Medical History:  ?Diagnosis Date  ? Arthritis   ? CHF (congestive heart failure) (HCC)   ? GERD (gastroesophageal reflux disease)   ? Hypertension   ? Renal disorder   ? TIA (transient ischemic attack)   ? ? ?Past Surgical History:  ?Procedure Laterality Date  ? ABDOMINAL HYSTERECTOMY    ? ? ?History reviewed. No pertinent family history. ? ?Social History:  reports that she has been smoking cigarettes. She has been smoking an average of 1 pack per day. She has never used smokeless tobacco. She reports current drug use. Drug: Marijuana. She reports that she does not drink alcohol. ? ?Allergies:  ?Allergies  ?Allergen Reactions  ? Meclizine Swelling  ?  Causes angioedema ?Causes angioedema ?Causes angioedema ?  ? Aspirin Hives  ?  Pt takes at home.  ? Lisinopril Swelling  ?  Gabapentin   ?  Dizziness   ? ? ?Medications: reviewed, no changes to patient's home medications ? ?Results for orders placed or performed during the hospital encounter of 06/14/21 (from the past 48 hour(s))  ?CBC with Differential     Status: Abnormal  ? Collection Time: 06/14/21  3:40 PM  ?Result Value Ref Range  ? WBC 12.0 (H) 4.0 - 10.5 K/uL  ? RBC 4.37 3.87 - 5.11 MIL/uL  ? Hemoglobin 13.1 12.0 - 15.0 g/dL  ? HCT 38.4 36.0 - 46.0 %  ? MCV 87.9 80.0 - 100.0 fL  ? MCH 30.0 26.0 - 34.0 pg  ? MCHC 34.1  30.0 - 36.0 g/dL  ? RDW 13.2 11.5 - 15.5 %  ? Platelets 228 150 - 400 K/uL  ? nRBC 0.0 0.0 - 0.2 %  ? Neutrophils Relative % 70 %  ? Neutro Abs 8.5 (H) 1.7 - 7.7 K/uL  ? Lymphocytes Relative 13 %  ? Lymphs Abs 1.6 0.7 - 4.0 K/uL  ? Monocytes Relative 15 %  ? Monocytes Absolute 1.8 (H) 0.1 - 1.0 K/uL  ? Eosinophils Relative 1 %  ? Eosinophils Absolute 0.1 0.0 - 0.5 K/uL  ? Basophils Relative 1 %  ? Basophils Absolute 0.1 0.0 - 0.1 K/uL  ? Immature Granulocytes 0 %  ? Abs Immature Granulocytes 0.04 0.00 - 0.07 K/uL  ?  Comment: Performed at [facility], [address] [place], [place] [address]  ?Basic metabolic panel     Status: Abnormal  ? Collection Time: 06/14/21  3:40 PM  ?Result Value Ref Range  ? Sodium 138 135 - 145 mmol/L  ? Potassium 3.1 (L) 3.5 - 5.1 mmol/L  ? Chloride 104 98 - 111 mmol/L  ? CO2 23 22 - 32 mmol/L  ? Glucose, Bld 105 (H) 70 - 99 mg/dL  ?  Comment: Glucose reference range applies only to samples taken after fasting for at least 8 hours.  ? BUN 11 8 - 23 mg/dL  ? Creatinine, Ser 0.84 0.44 - 1.00 mg/dL  ? Calcium 8.9 8.9 - 10.3 mg/dL  ? GFR, Estimated >60 >60 mL/min  ?  Comment: (NOTE) ?Calculated using the CKD-EPI Creatinine Equation (2021) ?  ? Anion gap 11 5 - 15  ?  Comment: Performed at [facility], [address] [place], [place] [address]  ?Basic metabolic panel     Status: Abnormal  ? Collection Time: 06/15/21 12:38 AM  ?Result Value Ref Range  ? Sodium 139 135 - 145 mmol/L  ? Potassium 3.6 3.5 - 5.1 mmol/L  ? Chloride 111 98 - 111 mmol/L  ? CO2 24 22 - 32 mmol/L  ? Glucose, Bld 101 (H) 70 - 99 mg/dL  ?  Comment: Glucose reference range applies only to samples taken after fasting for at least 8 hours.  ? BUN 7 (L) 8 - 23 mg/dL  ? Creatinine, Ser 0.67 0.44 - 1.00 mg/dL  ? Calcium 8.7 (L) 8.9 - 10.3 mg/dL  ? GFR, Estimated >60 >60 mL/min  ?  Comment: (NOTE) ?Calculated using the CKD-EPI Creatinine Equation (2021) ?  ? Anion gap 4 (L) 5 - 15  ?  Comment: Performed  at [facility], [address], [place], [place] [address]  ?CBC     Status: Abnormal  ? Collection Time: 06/15/21 12:38 AM  ?Result Value Ref Range  ? WBC 10.7 (H) 4.0 - 10.5 K/uL  ? RBC 4.27 3.87 -  5.11 MIL/uL  ? Hemoglobin 12.4 12.0 - 15.0 g/dL  ? HCT 37.4 36.0 - 46.0 %  ? MCV 87.6 80.0 - 100.0 fL  ? MCH 29.0 26.0 - 34.0 pg  ? MCHC 33.2 30.0 - 36.0 g/dL  ? RDW 13.2 11.5 - 15.5 %  ? Platelets 206 150 - 400 K/uL  ? nRBC 0.0 0.0 - 0.2 %  ?  Comment: Performed at [facility], [address], [place], [place] [address]  ? ? ?DG Hand Complete Right ? ?Result Date: 06/14/2021 ?CLINICAL DATA:  Swelling of the second digit. EXAM: RIGHT HAND - COMPLETE 3+ VIEW COMPARISON:  None. FINDINGS: No acute fracture or dislocation. No aggressive osseous lesion. Normal alignment. Mild osteoarthritis of first CMC joint. Severe soft tissue swelling of the second digit concerning for cellulitis. No radiopaque foreign body or soft tissue emphysema. IMPRESSION: 1. Severe soft tissue swelling of the second digit concerning for cellulitis. Electronically Signed   By: Elige Ko M.D.   On: 06/14/2021 15:40   ? ?ROS: 14 point review of systems negative except per HPI ? ?

## 2021-06-15 NOTE — Progress Notes (Signed)
?PROGRESS NOTE ? ? ? ? ?Angela Mueller, is a 81 y.o. female, DOB - 1940-04-07, [id number] ? ?Admit date - 06/14/2021   Admitting Physician Rise Patience, MD ? ?Outpatient Primary MD for the patient is Lurline Hare, MD ? ?LOS - 1 ? ?Chief Complaint  ?Patient presents with  ? Finger Injury  ? Wound Check  ?    ? ? ?Brief Narrative:  ?81 y.o. female with history of TIA, hypertension, cerebral aneurysm and dementia admitted on 06/14/2021 with right index finger cellulitis and concerns for possible abscess ? ?  ?-Assessment and Plan: ? ?1)Tenosynovitis/Cellulits of Rt Index finger---concern for possible abscess  ?--discussed with  Dr. Greta Doom who aspirated the area at bedside ?-Recommends CT right hand with contrast to see if deep seated infection or need for or drainage ?-Keep n.p.o. after midnight in case patient needs or drainage ?WBC 12.0 >>10.7 ?-Currently on vancomycin and cefepime ?-Consider de-escalating to vancomycin monotherapy on 06/16/2021 if continues to improve ? ?2)HYpokalemia--replaced and normalized ? ?3)Dementia--- obvious cognitive and memory deficits ?-Recent TSH, B12 and folate levels WNL ?-May use Seroquel for sleep and behavioral challenges ?- ?4)HTN-restart amlodipine at 5 mg daily, IV hydralazine as needed elevated BP ? ?5)H/o TIA--restart Lipitor, Plavix is on hold in case patient for I&D ? ?6) tobacco abuse--- patient was trying to smoke in the room lighter and cigarettes removed from patient ?-Give nicotine patch ? ?Disposition/Need for in-Hospital Stay- patient unable to be discharged at this time due to ---Tenosynovitis/Cellulits of Rt Index finger--requiring IV antibiotics, may require  I&D in OR ?-Possible discharge in a couple of days if improves ? ?Status is: Inpatient  ? ?Disposition: The patient is from: Home ?             Anticipated d/c is to: Home ?             Anticipated d/c date is: 2 days ?             Patient currently is not medically stable to d/c. ?Barriers: Not  Clinically Stable-  ? ?Code Status :  -  Code Status: Full Code  ? ?Family Communication:   ?Discussed with daughter Linwood Dibbles at bedside, also spoke with daughter Edwena Felty by phone ? ?DVT Prophylaxis  :   - SCDs   SCDs Start: 06/14/21 2352 ? ? ?Lab Results  ?Component Value Date  ? PLT 206 06/15/2021  ? ? ?Inpatient Medications ? ?Scheduled Meds: ? amLODipine  5 mg Oral Daily  ? atorvastatin  40 mg Oral Daily  ? nicotine  14 mg Transdermal Daily  ? QUEtiapine  50 mg Oral QHS  ? ?Continuous Infusions: ? ceFEPime (MAXIPIME) IV 2 g (06/15/21 0936)  ? vancomycin    ? ?PRN Meds:.hydrALAZINE ? ? ?Anti-infectives (From admission, onward)  ? ? Start     Dose/Rate Route Frequency Ordered Stop  ? 06/15/21 1600  vancomycin (VANCOREADY) IVPB 750 mg/150 mL       ? 750 mg ?150 mL/hr over 60 Minutes Intravenous Every 24 hours 06/14/21 1648    ? 06/15/21 1000  ceFEPIme (MAXIPIME) 2 g in sodium chloride 0.9 % 100 mL IVPB       ? 2 g ?200 mL/hr over 30 Minutes Intravenous Every 12 hours 06/15/21 0003    ? 06/15/21 0045  ceFEPIme (MAXIPIME) 2 g in sodium chloride 0.9 % 100 mL IVPB       ? 2 g ?200 mL/hr over 30 Minutes Intravenous  Once 06/14/21 2353  06/15/21 0123  ? 06/14/21 1600  vancomycin (VANCOCIN) IVPB 1000 mg/200 mL premix       ? 1,000 mg ?200 mL/hr over 60 Minutes Intravenous  Once 06/14/21 1550 06/14/21 1719  ? ?  ? ? Subjective: ?Angela Mueller today has no fevers, no emesis,  No chest pain,   ? ?-Occasional confusional episodes ? patient was trying to smoke in the room lighter and cigarettes removed from patient ?-Patient's daughter Linwood Dibbles and grandson at bedside, questions answered ?-Patient removed dressing from the right index finger even after being told not to remove it ? ?Objective: ?Vitals:  ? 06/14/21 2144 06/15/21 0427 06/15/21 0801 06/15/21 1219  ?BP: (!) 148/65 (!) 137/57 130/83 (!) 129/55  ?Pulse: 65 68 71 79  ?Resp: 19 17 16 17   ?Temp: 97.9 ?F (36.6 ?C) 99.3 ?F (37.4 ?C) 99.2 ?F (37.3 ?C) 99 ?F (37.2 ?C)   ?TempSrc: Oral Oral Oral Oral  ?SpO2: 98% 96% 95% 98%  ?Weight:      ?Height:      ? ? ?Intake/Output Summary (Last 24 hours) at 06/15/2021 1245 ?Last data filed at 06/15/2021 0100 ?Gross per 24 hour  ?Intake 441.77 ml  ?Output --  ?Net 441.77 ml  ? ?Filed Weights  ? 06/14/21 1439  ?Weight: 61.2 kg  ? ? ?Physical Exam ? ?Gen:- Awake Alert, no acute distress ?HEENT:- [place].AT, No sclera icterus ?Neck-Supple Neck,No JVD,.  ?Lungs-  CTAB , fair symmetrical air movement ?CV- S1, S2 normal, regular  ?Abd-  +ve B.Sounds, Abd Soft, No tenderness,    ?Extremity-pedal pulses present  ?Psych-memory and cognitive deficits consistent with dementia ?Neuro-no new focal deficits, no tremors ?MSK--  ?Media Information ?Document Information ? ?Photos  ?  ?06/14/2021 15:43  ?Attached To:  ?Hospital Encounter on 06/14/21  ? ?Source Information ? ?Blanchie Dessert, MD  [facility]  ? ? ? ?Data Reviewed: I have personally reviewed following labs and imaging studies ? ?CBC: ?Recent Labs  ?Lab 06/14/21 ?1540 06/15/21 ?0038  ?WBC 12.0* 10.7*  ?NEUTROABS 8.5*  --   ?HGB 13.1 12.4  ?HCT 38.4 37.4  ?MCV 87.9 87.6  ?PLT 228 206  ? ?Basic Metabolic Panel: ?Recent Labs  ?Lab 06/14/21 ?1540 06/15/21 ?0038  ?NA 138 139  ?K 3.1* 3.6  ?CL 104 111  ?CO2 23 24  ?GLUCOSE 105* 101*  ?BUN 11 7*  ?CREATININE 0.84 0.67  ?CALCIUM 8.9 8.7*  ? ?GFR: ?Estimated Creatinine Clearance: 45.8 mL/min (by C-G formula based on SCr of 0.67 mg/dL). ?Liver Function Tests: ?No results for input(s): AST, ALT, ALKPHOS, BILITOT, PROT, ALBUMIN in the last 168 hours. ?Cardiac Enzymes: ?No results for input(s): CKTOTAL, CKMB, CKMBINDEX, TROPONINI in the last 168 hours. ?BNP (last 3 results) ?No results for input(s): PROBNP in the last 8760 hours. ?HbA1C: ?No results for input(s): HGBA1C in the last 72 hours. ?Sepsis Labs: ?@LABRCNTIP (procalcitonin:4,lacticidven:4) ?)No results found for this or any previous visit (from the past 240 hour(s)).  ? ? ?Radiology Studies: ?DG  Hand Complete Right ? ?Result Date: 06/14/2021 ?CLINICAL DATA:  Swelling of the second digit. EXAM: RIGHT HAND - COMPLETE 3+ VIEW COMPARISON:  None. FINDINGS: No acute fracture or dislocation. No aggressive osseous lesion. Normal alignment. Mild osteoarthritis of first CMC joint. Severe soft tissue swelling of the second digit concerning for cellulitis. No radiopaque foreign body or soft tissue emphysema. IMPRESSION: 1. Severe soft tissue swelling of the second digit concerning for cellulitis. Electronically Signed   By: Kathreen Devoid M.D.   On: 06/14/2021  15:40   ? ? ?Scheduled Meds: ? amLODipine  5 mg Oral Daily  ? atorvastatin  40 mg Oral Daily  ? nicotine  14 mg Transdermal Daily  ? QUEtiapine  50 mg Oral QHS  ? ?Continuous Infusions: ? ceFEPime (MAXIPIME) IV 2 g (06/15/21 0936)  ? vancomycin    ? ? ? LOS: 1 day  ? ? ?Roxan Hockey M.D on 06/15/2021 at 12:45 PM ? ?Go to [email or web address] - for contact info ? ?[facility] - Office  [phone] ? ?If 7PM-7AM, please contact night-coverage ?[email or web address] ?Password [email or web address] ?06/15/2021, 12:45 PM  ? ? ?

## 2021-06-15 NOTE — Progress Notes (Signed)
Nursing staff found pt wandering in hallway, pt confused agitated uncooperative refusing assistance. Unable to redirect pt. Staff assisted pt to sit in chair located near nurse station. Charge nurse called hospital security for asst., charge nurse notified pt daughter. Two female security officers arrived to safely support and asst pt/nursing staff. Pt behavior escalated to belligerent behavior, pt with loud voice, pt with strong forceful motor resistance BUE, pt removing rt finger dressing, bilateral soft mittens applied by this rn. Engineer, materials and nursing staff pushed rolling chair while pt sitting in chair. Charge nurse and staff asstd pt back to bed, pt incontinent of stool, incontinent/hygiene care provided by staff. Bed lowered floor mats placed and bed alarms activated.haldol 5 mg IM given by charge nurse.  ?

## 2021-06-15 NOTE — Progress Notes (Signed)
?  Attempted to do CT of the right hand with contrast ? ?-Patient became agitated and pulled out a IV ?- ?CT right hand study was changed to CT without contrast ?- ?Patient still refused to cooperate ?- ?Family notified ?-Plan is for family to come and sit with patient so patient will be more cooperative with IV placement and IV antibiotics and CT scan study ?-As needed lorazepam/Haldol ordered ?- ?Clinically underlying dementia with sundowning and behavioral disturbance ? ?Roxan Hockey, MD ? ?

## 2021-06-15 NOTE — Progress Notes (Signed)
Pt down to ct via stretcher by transport tech, pt confused confused redirectable, pt family member at bedside during transport to ct. PIV in place ?

## 2021-06-16 DIAGNOSIS — L03113 Cellulitis of right upper limb: Secondary | ICD-10-CM | POA: Diagnosis not present

## 2021-06-16 DIAGNOSIS — F03918 Unspecified dementia, unspecified severity, with other behavioral disturbance: Secondary | ICD-10-CM

## 2021-06-16 DIAGNOSIS — M659 Synovitis and tenosynovitis, unspecified: Secondary | ICD-10-CM | POA: Diagnosis not present

## 2021-06-16 DIAGNOSIS — I671 Cerebral aneurysm, nonruptured: Secondary | ICD-10-CM | POA: Diagnosis not present

## 2021-06-16 LAB — CBC
HCT: 36.6 % (ref 36.0–46.0)
Hemoglobin: 12.4 g/dL (ref 12.0–15.0)
MCH: 29.7 pg (ref 26.0–34.0)
MCHC: 33.9 g/dL (ref 30.0–36.0)
MCV: 87.8 fL (ref 80.0–100.0)
Platelets: 214 10*3/uL (ref 150–400)
RBC: 4.17 MIL/uL (ref 3.87–5.11)
RDW: 13.1 % (ref 11.5–15.5)
WBC: 6.9 10*3/uL (ref 4.0–10.5)
nRBC: 0 % (ref 0.0–0.2)

## 2021-06-16 LAB — BASIC METABOLIC PANEL
Anion gap: 7 (ref 5–15)
BUN: 6 mg/dL — ABNORMAL LOW (ref 8–23)
CO2: 22 mmol/L (ref 22–32)
Calcium: 8.7 mg/dL — ABNORMAL LOW (ref 8.9–10.3)
Chloride: 109 mmol/L (ref 98–111)
Creatinine, Ser: 0.68 mg/dL (ref 0.44–1.00)
GFR, Estimated: >60 mL/min (ref >60)
Glucose, Bld: 87 mg/dL (ref 70–99)
Potassium: 3.5 mmol/L (ref 3.5–5.1)
Sodium: 138 mmol/L (ref 135–145)

## 2021-06-16 LAB — GLUCOSE, CAPILLARY: Glucose-Capillary: 87 mg/dL (ref 70–99)

## 2021-06-16 MED ORDER — MORPHINE SULFATE (PF) 2 MG/ML IV SOLN: 1.0000 mg | INTRAVENOUS | Status: DC | PRN

## 2021-06-16 NOTE — Progress Notes (Signed)
This rn called to update pt daughter rochelle. Informed rochelle pt sleeping calmly when not disturbed, pt becomes agitated and begins to yell "leave me alone" "stop messing with me" and uses vulgar language when attempting to provide any type nursing care. Informed daughter pt unable to sleep during the night and required medication for same type behavior. [name] states pt daughter Malena Catholic coming to hospital around 1000. ?

## 2021-06-16 NOTE — Progress Notes (Addendum)
? ? ? [facility] ?                                                                            ? ? ?Angela Mueller, is a 81 y.o. female, DOB - December 03, 1940, [id number] ?Admit date - 06/14/2021    ?Outpatient Primary MD for the patient is Lurline Hare, MD ? ?LOS - 2  days ? ? ? ?Brief summary  ? ? ?80 y.o. female with history of dementia, TIA, hypertension, cerebral aneurysm and dementia admitted on 06/14/2021 with right index finger cellulitis  ? ? ?Assessment & Plan  ? ? ?Assessment and Plan: ? ?Tenosynovitis/cellulitis of the right index finger ?-Orthopedics following. Dr. Greta Doom, aspirated the area at the bedside on 4/15 ?-CT right hand without contrast consistent with cellulitis, no deep abscess.  Unfortunately due to dementia and agitation, patient refused CT with contrast. ?-Continue IV vancomycin, cefepime ?- ?  Plan for I&D in OR ?Addendum 1:20PM ?Discussed with Dr. Greta Doom, no plans for OR.  Explained to the daughter, will start back on diet ? ?Dementia with sundowning ?-Recent TSH, B12, folate normal ?-Started on Seroquel 50 mg daily, continue Haldol as needed ? ?HTN ?Continue amlodipine ? ?History of TIA ?Continue Lipitor.  ?Plavix currently on hold, Dr. Greta Doom had mentioned possible plan for I&D in OR.  Continue n.p.o. ? ?Tobacco use ?Continue nicotine patch ? ?Code Status: Full CODE STATUS ?DVT Prophylaxis:  SCDs Start: 06/14/21 2352 ? ? ?Level of Care: Level of care: Med-Surg ?Family Communication: Updated daughter  ?Disposition Plan:     Remains inpatient appropriate:    ? ?Procedures:  ?Bedside aspiration ? ?Consultants:   ?Orthopedics ? ?Antimicrobials:  ?IV vancomycin  4/14 -->  ?IV cefepime      4/14-->  ? ? ?Medications ? ? amLODipine  5 mg Oral Daily  ? atorvastatin  40 mg Oral Daily  ? nicotine  14 mg Transdermal Daily  ? QUEtiapine  50 mg Oral QHS  ? ? ? ?Subjective:  ? ?Angela Mueller was seen and examined today.  Confused and irritable.  Refused examination stating "go  away" ? ?Objective:  ? ?Vitals:  ? 06/14/21 2144 06/15/21 0427 06/15/21 0801 06/15/21 1219  ?BP: (!) 148/65 (!) 137/57 130/83 (!) 129/55  ?Pulse: 65 68 71 79  ?Resp: 19 17 16 17   ?Temp:  99.3 ?F (37.4 ?C) 99.2 ?F (37.3 ?C) 99 ?F (37.2 ?C)  ?TempSrc: Oral Oral Oral Oral  ?SpO2: 98% 96% 95% 98%  ?Weight:      ?Height:      ? ? ?Intake/Output Summary (Last 24 hours) at 06/16/2021 1204 ?Last data filed at 06/15/2021 2230 ?Gross per 24 hour  ?Intake --  ?Output 2 ml  ?Net -2 ml  ? ?Filed Weights  ? 06/14/21 1439  ?Weight: 61.2 kg  ? ? ? ?Exam ?General: Awake alert but disoriented, irritable ?Cardiovascular: refused exam ?Respiratory: CTA B ?Gastrointestinal: refused exam ?Ext: no pedal edema bilaterally ?Neuro: moving all 4 extremities spontaneously but not following commands ?Psych:  confused and agitated ? ? ?Data Reviewed:  I have personally reviewed following labs  ? ? ?CBC ?Lab Results  ?Component Value Date  ? WBC 10.7 (H) 06/15/2021  ?  RBC 4.27 06/15/2021  ? HGB 12.4 06/15/2021  ? HCT 37.4 06/15/2021  ? MCV 87.6 06/15/2021  ? MCH 29.0 06/15/2021  ? PLT 206 06/15/2021  ? MCHC 33.2 06/15/2021  ? RDW 13.2 06/15/2021  ? LYMPHSABS 1.6 06/14/2021  ? MONOABS 1.8 (H) 06/14/2021  ? EOSABS 0.1 06/14/2021  ? BASOSABS 0.1 06/14/2021  ? ? ? ?Last metabolic panel ?Lab Results  ?Component Value Date  ? NA 139 06/15/2021  ? K 3.6 06/15/2021  ? CL 111 06/15/2021  ? CO2 24 06/15/2021  ? BUN 7 (L) 06/15/2021  ? CREATININE 0.67 06/15/2021  ? GLUCOSE 101 (H) 06/15/2021  ? GFRNONAA >60 06/15/2021  ? GFRAA 43 (L) 02/24/2014  ? CALCIUM 8.7 (L) 06/15/2021  ? PROT 8.1 03/13/2021  ? ALBUMIN 3.8 03/13/2021  ? BILITOT 0.5 03/13/2021  ? ALKPHOS 109 03/13/2021  ? AST 21 03/13/2021  ? ALT 15 03/13/2021  ? ANIONGAP 4 (L) 06/15/2021  ? ? ? ? ? ?Radiology Studies: I have personally reviewed the imaging studies  ?CT HAND RIGHT WO CONTRAST ? ?Result Date: 06/15/2021 ?IMPRESSION: 1. Subcutaneous edema throughout the dorsum of the right hand, thenar  eminence, and second digit compatible with cellulitis. Marked decreased soft tissue swelling within the radial aspect of the hand and second digit since prior x-ray. 2. No evidence of fluid collection or abscess on this unenhanced exam. 3. Mild osteoarthritis.  No acute or destructive bony lesions. Electronically Signed   By: Randa Ngo M.D.   On: 06/15/2021 21:28  ? ?DG Hand Complete Right ? ?Result Date: 06/14/2021 ?IMPRESSION: 1. Severe soft tissue swelling of the second digit concerning for cellulitis. Electronically Signed   By: Kathreen Devoid M.D.   On: 06/14/2021 15:40   ? ? ? ? ?Estill Cotta M.D. ?[facility] ?06/16/2021, 12:04 PM ? ?Available via Epic secure chat 7am-7pm ?After 7 pm, please refer to night coverage provider listed on amion. ? ?  ?

## 2021-06-16 NOTE — Progress Notes (Signed)
Phlebotomy states pt refusing blood draw. This rn arrived to room to provide emotional support to pt and encourage/explain need for blood draw, pt continues to verbally and physically resist.  ?

## 2021-06-16 NOTE — Progress Notes (Signed)
This rn called pt daughter rochelle to explain pt with periods of calm sleeping but conts to yell and resist all nursing care when aroused, explained need for IV to give meds/antibiotics and need for ct. Angela Mueller explains that she is personally exhausted and needs to briefly rest but will come to hospital with next 1- 2 hours. ?

## 2021-06-16 NOTE — Consult Note (Signed)
WOC Nurse Consult Note: ?Reason for Consult:Right index finger infection ?Wound type:Infectious ?Pressure Injury POA: N/A ? ?Patient consult received, patient is being followed by Orthopedics/Hand Surgery, specifically, Dr. Yehuda Budd.  Aspiration performed to this digit by that provider on 06/15/21. I defer any care management orders to him/his associates. Please contact that office for dressing care guidance. ? ?I communicated this to Dr. Isidoro Donning vis Secure Chat. ? ?WOC nursing team will not follow, but will remain available to this patient, the nursing and medical teams.  ? ?Thanks, ?Ladona Mow, MSN, RN, GNP, CWOCN, CWON-AP, FAAN  ?Pager# [phone]  ? ? ? ?  ?

## 2021-06-16 NOTE — Progress Notes (Addendum)
Pt granddaughter arrived to bedside, gdaughter informed nursing secretary she think pt "needs her nicotine patch" ?

## 2021-06-17 DIAGNOSIS — M659 Synovitis and tenosynovitis, unspecified: Secondary | ICD-10-CM | POA: Diagnosis not present

## 2021-06-17 NOTE — Care Management Important Message (Signed)
Important Message ? ?Patient Details  ?Name: Angela Mueller ?MRN: [id number] ?Date of Birth: [birth year] ? ? ?Medicare Important Message Given:  Yes ? ? ? ? ?Levone Otten ?06/17/2021, 4:12 PM ?

## 2021-06-17 NOTE — Plan of Care (Signed)
?  Problem: Health Behavior/Discharge Planning: ?Goal: Ability to manage health-related needs will improve ?Outcome: Progressing ?  ?Problem: Clinical Measurements: ?Goal: Ability to maintain clinical measurements within normal limits will improve ?Outcome: Progressing ?Goal: Will remain free from infection ?Outcome: Progressing ?Goal: Diagnostic test results will improve ?Outcome: Progressing ?Goal: Respiratory complications will improve ?Outcome: Progressing ?Goal: Cardiovascular complication will be avoided ?Outcome: Progressing ?  ?Problem: Activity: ?Goal: Risk for activity intolerance will decrease ?Outcome: Progressing ?  ?Problem: Nutrition: ?Goal: Adequate nutrition will be maintained ?Outcome: Progressing ?  ?Problem: Coping: ?Goal: Level of anxiety will decrease ?Outcome: Progressing ?  ?Problem: Elimination: ?Goal: Will not experience complications related to bowel motility ?Outcome: Progressing ?Goal: Will not experience complications related to urinary retention ?Outcome: Progressing ?  ?Problem: Pain Managment: ?Goal: General experience of comfort will improve ?Outcome: Progressing ?  ?Problem: Safety: ?Goal: Ability to remain free from injury will improve ?Outcome: Progressing ?  ?Problem: Skin Integrity: ?Goal: Risk for impaired skin integrity will decrease ?Outcome: Progressing ?  ?Problem: Education: ?Goal: Knowledge of General Education information will improve ?Description: Including pain rating scale, medication(s)/side effects and non-pharmacologic comfort measures ?Outcome: Not Progressing ?  ?Problem: Safety: ?Goal: Non-violent Restraint(s) ?Outcome: Completed/Met ?  ?

## 2021-06-17 NOTE — Progress Notes (Signed)
?PROGRESS NOTE ? ?Angela Mueller  CVE:[id number] DOB: 09-06-1940 DOA: 06/14/2021 ?PCP: Tenna Delaine, MD  ? ?Brief Narrative: ? ?Patient is 81 year old female with history of dementia, TIA, hypertension, cerebral aneurysm who was admitted for the management of right index finger cellulitis.  Orthopedics was consulted after admission and she underwent bedside aspiration of the infected finger.  Currently on broad-spectrum antibiotics.  Orthopedics following.  ? ?Assessment & Plan: ? ?Principal Problem: ?  Tenosynovitis of right hand ?Active Problems: ?  Tenosynovitis/Cellulits of Rt Index finger ?  Essential hypertension ?  Dementia with behavioral disturbance (HCC) ?  Cerebral aneurysm without rupture ?  Tobacco abuse ? ?Tenosynovitis/cellulitis of right index finger: Presented with pain, worsening of swelling of right index finger.  No report of injury or insect bite or animal bite. ?Orthopedics consulted.  Underwent aspiration of the bedside on 4/15.  Initially she was plan for I&D in OR but was canceled.  Currently on vancomycin and cefepime.  Continue broad-spectrum antibiotics for now.  Orthopedics will follow her today ? ?History of dementia: Pleasantly confused.  Oriented to place only.  On Seroquel 50 mg daily.  Continue supportive care, delirium precautions.  As per patient she does not have any ambulatory problems ? ?Hypertension: Currently Bp stable.  On amlodipine ? ?History of TIA: On Lipitor ? ?Tobacco use: On nicotine patch ?  ? ? ?  ?  ? ?DVT prophylaxis:SCDs Start: 06/14/21 2352 ? ? ?  Code Status: Full Code ? ?Family Communication: None at the bedside ? ?Patient status: Inpatient ? ?Patient is from : Home ? ?Anticipated discharge to: Home ? ?Estimated DC date: Likely tomorrow.  Orthopedics following her today ? ? ?Consultants: Orthopedics ? ?Procedures: Bedside I&D ? ?Antimicrobials:  ?Anti-infectives (From admission, onward)  ? ? Start     Dose/Rate Route Frequency Ordered Stop  ? 06/15/21  1600  vancomycin (VANCOREADY) IVPB 750 mg/150 mL       ? 750 mg ?150 mL/hr over 60 Minutes Intravenous Every 24 hours 06/14/21 1648    ? 06/15/21 1000  ceFEPIme (MAXIPIME) 2 g in sodium chloride 0.9 % 100 mL IVPB       ? 2 g ?200 mL/hr over 30 Minutes Intravenous Every 12 hours 06/15/21 0003    ? 06/15/21 0045  ceFEPIme (MAXIPIME) 2 g in sodium chloride 0.9 % 100 mL IVPB       ? 2 g ?200 mL/hr over 30 Minutes Intravenous  Once 06/14/21 2353 06/15/21 0123  ? 06/14/21 1600  vancomycin (VANCOCIN) IVPB 1000 mg/200 mL premix       ? 1,000 mg ?200 mL/hr over 60 Minutes Intravenous  Once 06/14/21 1550 06/14/21 1719  ? ?  ? ? ?Subjective: ?Patient seen and examined at the bedside this morning.  Hemodynamically stable.  Comfortably lying on bed.  Complains of some pain on the right index finger otherwise no new complaints.  No fever or chills ? ?Objective: ?Vitals:  ? 06/16/21 2021 06/17/21 0258 06/17/21 0803 06/17/21 0900  ?BP: (!) 96/45 (!) 124/50 (!) 113/54 (!) 142/64  ?Pulse: 66 (!) 57 64 66  ?Resp: 18 17 17 16   ?Temp: 98.9 ?F (37.2 ?C) (!) 97.5 ?F (36.4 ?C) (!) 97.5 ?F (36.4 ?C)   ?TempSrc: Oral Oral    ?SpO2: 97% 98% 100% 99%  ?Weight:      ?Height:      ? ? ?Intake/Output Summary (Last 24 hours) at 06/17/2021 1020 ?Last data filed at 06/17/2021 0900 ?Gross per 24  hour  ?Intake 722.44 ml  ?Output --  ?Net 722.44 ml  ? ?Filed Weights  ? 06/14/21 1439  ?Weight: 61.2 kg  ? ? ?Examination: ? ?General exam: Overall comfortable, not in distress ?HEENT: PERRL ?Respiratory system:  no wheezes or crackles  ?Cardiovascular system: S1 & S2 heard, RRR.  ?Gastrointestinal system: Abdomen is nondistended, soft and nontender. ?Central nervous system: Alert and oriented ?Extremities: No edema, no clubbing ,no cyanosis, right index finger wrapped with dressing.  No significant drainage, degloving of the skin of the right index finger with underlying healthy granulation tissue ?Skin: No rashes, no ulcers,no icterus   ? ? ?Data Reviewed: I  have personally reviewed following labs and imaging studies ? ?CBC: ?Recent Labs  ?Lab 06/14/21 ?1540 06/15/21 ?0038 06/16/21 ?1203  ?WBC 12.0* 10.7* 6.9  ?NEUTROABS 8.5*  --   --   ?HGB 13.1 12.4 12.4  ?HCT 38.4 37.4 36.6  ?MCV 87.9 87.6 87.8  ?PLT 228 206 214  ? ?Basic Metabolic Panel: ?Recent Labs  ?Lab 06/14/21 ?1540 06/15/21 ?0038 06/16/21 ?1203  ?NA 138 139 138  ?K 3.1* 3.6 3.5  ?CL 104 111 109  ?CO2 23 24 22   ?GLUCOSE 105* 101* 87  ?BUN 11 7* 6*  ?CREATININE 0.84 0.67 0.68  ?CALCIUM 8.9 8.7* 8.7*  ? ? ? ?No results found for this or any previous visit (from the past 240 hour(s)).  ? ?Radiology Studies: ?CT HAND RIGHT WO CONTRAST ? ?Result Date: 06/15/2021 ?CLINICAL DATA:  Pain, infection, second digit swelling EXAM: CT OF THE RIGHT HAND WITHOUT CONTRAST TECHNIQUE: Multidetector CT imaging of the right hand was performed according to the standard protocol. Multiplanar CT image reconstructions were also generated. RADIATION DOSE REDUCTION: This exam was performed according to the departmental dose-optimization program which includes automated exposure control, adjustment of the mA and/or kV according to patient size and/or use of iterative reconstruction technique. COMPARISON:  06/14/2021 FINDINGS: Bones/Joint/Cartilage No acute or destructive bony lesions. No periosteal reaction to suggest osteomyelitis. 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 Ligaments Suboptimally assessed by CT. Muscles and Tendons No gross abnormalities. Soft tissues There is mild diffuse subcutaneous edema most pronounced within the dorsum of the hand and within the thenar eminence. Mild subcutaneous fat stranding persists within the right second digit, though marked decrease in soft tissue swelling since the x-ray performed yesterday. No evidence of fluid collection or abscess on this unenhanced exam. No subcutaneous gas or radiopaque foreign body. Reconstructed images demonstrate  no additional findings. IMPRESSION: 1. Subcutaneous edema throughout the dorsum of the right hand, thenar eminence, and second digit compatible with cellulitis. Marked decreased soft tissue swelling within the radial aspect of the hand and second digit since prior x-ray. 2. No evidence of fluid collection or abscess on this unenhanced exam. 3. Mild osteoarthritis.  No acute or destructive bony lesions. Electronically Signed   By: 06/16/2021 M.D.   On: 06/15/2021 21:28   ? ?Scheduled Meds: ? amLODipine  5 mg Oral Daily  ? atorvastatin  40 mg Oral Daily  ? nicotine  14 mg Transdermal Daily  ? QUEtiapine  50 mg Oral QHS  ? ?Continuous Infusions: ? ceFEPime (MAXIPIME) IV 2 g (06/17/21 0939)  ? vancomycin 750 mg (06/16/21 2008)  ? ? ? LOS: 3 days  ? ?2009, MD ?[facility] ?P4/17/2023, 10:20 AM   ?

## 2021-06-17 NOTE — Progress Notes (Signed)
Pharmacy Antibiotic Note ? ?Angela Mueller is a 81 y.o. female admitted on 06/14/2021 with R index finger wound infection .  Pharmacy has been consulted for vancomycin dosing. ? ?Plan: ?Continue Vancomycin 750 mg IV Q 24 hrs.  ?     Goal AUC 400-550. ?     Expected AUC: 452 ?     SCr used: 0.84 ?Continue Cefepime 2 g IV Q 12h hrs ? ?-Monitor CBC, renal fx, cultures and clinical progress ?-Vanc levels as indicated  ? ? ?Height: 5\' 2"  (157.5 cm) ?Weight: 61.2 kg (135 lb) ?IBW/kg (Calculated) : 50.1 ? ?Temp (24hrs), Avg:98 ?F (36.7 ?C), Min:97.5 ?F (36.4 ?C), Max:98.9 ?F (37.2 ?C) ? ?Recent Labs  ?Lab 06/14/21 ?1540 06/15/21 ?0038 06/16/21 ?1203  ?WBC 12.0* 10.7* 6.9  ?CREATININE 0.84 0.67 0.68  ? ?  ?Estimated Creatinine Clearance: 45.8 mL/min (by C-G formula based on SCr of 0.68 mg/dL).   ? ?Allergies  ?Allergen Reactions  ? Meclizine Swelling  ?  Causes angioedema ?Causes angioedema ?Causes angioedema ?  ? Bayer Aspirin [Aspirin] Hives  ?  Other brands are ok.  ? Lisinopril Swelling  ? Gabapentin Other (See Comments)  ?  Dizziness   ? ? ?Antimicrobials this admission: ?Vancomycin 4/14 >>  ?Cefepime 4/15 >>  ? ?Dose adjustments this admission: ?N/A ? ?Microbiology results: ?None ? ?Thank you for allowing pharmacy to be a part of this patient?s care. ? ?Luisa Hart, PharmD, BCPS ?Clinical Pharmacist ?06/17/2021 12:46 PM  ? ?Please refer to [facility] for pharmacy phone number  ?

## 2021-06-17 NOTE — Discharge Instructions (Signed)
?  Orthopaedic Hand Surgery Discharge Instructions ? ?WEIGHT BEARING STATUS: Non weight bearing on operative extremity ? ?DRESSINGS: ok to shower, pat dry and apply antibiotic ointment followed by dry gauze and light wrap. This can be changed daily.  ? ?PAIN CONTROL: First line medications for post operative pain control are Tylenol (acetaminophen) and Motrin (ibuprofen) if you are able to take these medications. If you have been prescribed a medication these can be taken as breakthrough pain medications. Please note that some narcotic pain medication has acetaminophen added and you should never consume more than 4,000mg  of acetaminophen in 24-hour period. Please note that if you are given Toradol (ketorolac) you should not take similar medications such as ibuprofen or naproxen. ? ?DISCHARGE MEDICATIONS: If you have been prescribed medication it was sent electronically to your pharmacy. No changes have been made to your home medications. ? ?ICE/ELEVATION: Ice and elevate your injured extremity as needed. Avoid direct contact of ice with skin.  ? ?BANDAGE FEELS TOO TIGHT: If your bandage feels too tight, first make sure you are elevating your fingers as much as possible. The outer layer of the bandage can be unwrapped and reapplied more loosely. If no improvement, you may carefully cut the inner layer longitudinally until the pressure has resolved and then rewrap the outer layer. If you are not comfortable with these instructions, please call the office and the bandage can be changed for you.  ? ?FOLLOW UP: 1 week follow up. You will be called after surgery with an appointment date and time, however if you have not received a phone call within 3 days, please call during regular office hours at [phone] to schedule a post operative appointment. ? ?Please Seek Medical Attention if: ?Call MD for: pain or pressure in chest, jaw, arm, back, neck  ?Call MD for: temperature greater than 101 F for more than 24 hrs ?Call  MD for: difficulty breathing ?Call MD for: incision redness, bleeding, drainage  ?Call MD for: palpitations or feeling that the heart is racing  ?Call MD for: increased swelling in arm, leg, ankle, or abdomen  ?Call MD for: lightheadedness, dizziness, fainting ?Call 911 or go to ER for any medical emergency if you are not able to get in touch with your doctor ? ? ?J. Sable Feil, MD ?Orthopaedic Hand Surgeon ?[facility] ?Office number: [phone] ?[address] ?[place],  [address] ? ? ?

## 2021-06-17 NOTE — Care Management Important Message (Signed)
Important Message ? ?Patient Details  ?Name: Angela Mueller ?MRN: [id number] ?Date of Birth: 06-20-1940 ? ? ?Medicare Important Message Given:  Yes ? ? ? ? ?Florabelle Cardin ?06/17/2021, 4:10 PM ?

## 2021-06-17 NOTE — Progress Notes (Signed)
The patient was reevaluated today at bedside for her right index finger infection.  This was drained via an 18-gauge needle as it was a very superficial abscess that did spread across the entire finger.  This is now nicely decompressed and she is improving in terms of pain finger range of motion and her leukocytosis has resolved.  The patient feels well and like to be discharged.  The CT scan was performed does not show any deep abscess and her clinical exam does not show any continued infection or worsening infection does feel much better and feels as though her finger is moving better.  The skin that was blistering will likely slough off with new skin that will epithelialize over time however I do not recommend peeling this off as it will be quite painful and this can act as a biologic dressing.  I do encourage her to keep it clean and dry and it is okay to shower with soapy water and perform dressing changes daily.  Okay for discharge from my perspective with p.o. antibiotics and follow-up in 1 week in my office for repeat clinical exam.  Patient needs to continue to work on finger range of motion via home exercise program. ? ?J. Standley Dakins, MD ?Orthopaedic Hand Surgeon ? ?

## 2021-06-18 MED ORDER — SULFAMETHOXAZOLE-TRIMETHOPRIM 800-160 MG PO TABS: 1.0000 | ORAL_TABLET | Freq: Two times a day (BID) | ORAL | 0 refills | Status: AC

## 2021-06-18 NOTE — TOC Initial Note (Signed)
Transition of Care (TOC) - Initial/Assessment Note  ? ? ?Patient Details  ?Name: Angela Mueller ?MRN: [id number] ?Date of Birth: [birth year] ? ?Transition of Care (TOC) CM/SW Contact:    ?Kingsley Plan, RN ?Phone Number: ?06/18/2021, 11:32 AM ? ?Clinical Narrative:                 ?Patient with dementia. Spoke to daughter Darcus Pester [phone] via phone.  Patient lives with daughter. Discussed HHRN. Daughter aware patient has daily dressing change and HHRN will not be able to make daily visits. Bedside nurse will provide daughter with wound care education . Daughter aware and voiced understanding ? ?Kandee Keen with [facility] accepted [facility] for [facility]   ? ?Expected Discharge Plan: Home w Home Health Services ?Barriers to Discharge: No Barriers Identified ? ? ?Patient Goals and CMS Choice ?  ?  ?Choice offered to / list presented to : Adult Children ? ?Expected Discharge Plan and Services ?Expected Discharge Plan: Home w Home Health Services ?  ?Discharge Planning Services: CM Consult ?Post Acute Care Choice: Home Health ?Living arrangements for the past 2 months: Single Family Home ?Expected Discharge Date: 06/18/21               ?DME Arranged: N/A ?  ?  ?  ?  ?HH Arranged: RN ?HH Agency: [facility] ?Date HH Agency Contacted: 06/18/21 ?Time HH Agency Contacted: 1132 ?Representative spoke with at [facility] Agency: Kandee Keen ? ?Prior Living Arrangements/Services ?Living arrangements for the past 2 months: Single Family Home ?Lives with:: Adult Children ?  ?       ?  ?  ?Current home services: DME ?  ? ?Activities of Daily Living ?  ?  ? ?Permission Sought/Granted ?  ?  ?   ?   ?   ?   ? ?Emotional Assessment ?  ?  ?  ?  ?  ?  ? ?Admission diagnosis:  Tenosynovitis of finger [M65.9] ?Cellulitis of right upper extremity [L03.113] ?Tenosynovitis of right hand [M65.9] ?Patient Active Problem List  ? Diagnosis Date Noted  ? Tobacco abuse 06/15/2021  ? Tenosynovitis of right hand 06/14/2021  ? Tenosynovitis/Cellulits  of Rt Index finger 06/14/2021  ? Dementia with behavioral disturbance (HCC) 03/14/2021  ? Dizziness 03/14/2021  ? Hypokalemia 03/14/2021  ? Essential hypertension 03/14/2021  ? Glaucoma 03/14/2021  ? Cerebral aneurysm without rupture 03/14/2021  ? Expressive aphasia 03/13/2021  ? ?PCP:  Tenna Delaine, MD ?Pharmacy:   ?[id number] - [place], [place] - [address] ?[address] ?[place] [address] ?Phone: [phone] Fax: [phone] ? ? ? ? ?Social Determinants of Health (SDOH) Interventions ?  ? ?Readmission Risk Interventions ?   ? View : No data to display.  ?  ?  ?  ? ? ? ?

## 2021-06-18 NOTE — Progress Notes (Signed)
Discharge instructions provided to patient and patients daughter. Dressing change instructions provided to daughter who verbalizes understanding. Future appointments reviewed with patients and patient daughter. Patient discharged ?

## 2021-06-18 NOTE — Progress Notes (Signed)
Mobility Specialist Progress Note: ? ? 06/18/21 1057  ?Mobility  ?Activity Ambulated with assistance in hallway  ?Level of Assistance Standby assist, set-up cues, supervision of patient - no hands on  ?Assistive Device None  ?Distance Ambulated (ft) 300 ft  ?Activity Response Tolerated well  ?$Mobility charge 1 Mobility  ? ?Pt received coming out of bathroom with nursing student. Complaints of her legs feeling sore. Left in chair with call bell in reach and all needs met. ? ?Angela Mueller ?Mobility Specialist ?Primary Phone [phone] ? ?

## 2021-06-18 NOTE — Discharge Summary (Signed)
Physician Discharge Summary  ?Angela Mueller YQM:[id number] DOB: December 01, 1940 DOA: 06/14/2021 ? ?PCP: Tenna DelaineBasrai, Khaishoon N, MD ? ?Admit date: 06/14/2021 ?Discharge date: 06/18/2021 ? ?Admitted From: Home ?Disposition:  Home ? ?Discharge Condition:Stable ?CODE STATUS:FULL ?Diet recommendation: Heart Healthy  ? ?Brief/Interim Summary: ? ?Patient is 81 year old female with history of dementia, TIA, hypertension, cerebral aneurysm who was admitted for the management of right index finger cellulitis.  Orthopedics was consulted after admission and she underwent bedside aspiration of the infected finger. She was started on  broad-spectrum antibiotics.  Orthopedics recommended outpatient follow-up.  Antibiotics changed to oral.  Medically stable for discharge. ? ?Following problems were addressed during her hospitalization: ? ?Tenosynovitis/cellulitis of right index finger: Presented with pain, worsening of swelling of right index finger.  No report of injury or insect bite or animal bite. ?Orthopedics consulted.  Underwent aspiration of the bedside on 4/15.  Initially she was plan for I&D in OR but was canceled.  She was on  vancomycin and cefepime.  Orthopedics cleared her for discharge, she will follow-up with orthopedics in a week.  Recommended to keep the wound dry, daily dressing changes.  Antibiotics changed to oral ? ?history of dementia: Pleasantly confused.  Oriented to place only.   As per report, she does not have any ambulatory problems ?  ?Hypertension: Currently Bp stable.  On amlodipine and losartan at home.  Losartan discontinued on discharge ?  ?History of TIA: On Lipitor ?  ?Tobacco use: Was offered nicotine patch.  Counseled for cessation. ? ?Discharge Diagnoses:  ?Principal Problem: ?  Tenosynovitis of right hand ?Active Problems: ?  Tenosynovitis/Cellulits of Rt Index finger ?  Essential hypertension ?  Dementia with behavioral disturbance (HCC) ?  Cerebral aneurysm without rupture ?  Tobacco  abuse ? ? ? ?Discharge Instructions ? ?Discharge Instructions   ? ? Diet - low sodium heart healthy   Complete by: As directed ?  ? Discharge instructions   Complete by: As directed ?  ? 1)Please take prescribed medication as instructed ?2)Follow up with orthopedics in 1 week.  Name and number of the provider has been addressed.  Call for appointment ?3) we have discontinued losartan because her blood pressure is stable without it.  Monitor blood pressure at home.  Follow-up with your PCP in 2 weeks  ? Discharge wound care:   Complete by: As directed ?  ? keep it clean and dry and it is okay to shower with soapy water and perform dressing changes daily  ? Increase activity slowly   Complete by: As directed ?  ? ?  ? ?Allergies as of 06/18/2021   ? ?   Reactions  ? Meclizine Swelling  ? Causes angioedema ?Causes angioedema ?Causes angioedema  ? Bayer Aspirin [aspirin] Hives  ? Other brands are ok.  ? Lisinopril Swelling  ? Gabapentin Other (See Comments)  ? Dizziness   ? ?  ? ?  ?Medication List  ?  ? ?STOP taking these medications   ? ?clopidogrel 75 MG tablet ?Commonly known as: PLAVIX ?  ?losartan 100 MG tablet ?Commonly known as: COZAAR ?  ? ?  ? ?TAKE these medications   ? ?acetaminophen 500 MG tablet ?Commonly known as: TYLENOL ?Take 1,000 mg by mouth every 6 (six) hours as needed for pain. ?  ?amLODipine 10 MG tablet ?Commonly known as: NORVASC ?Take 10 mg by mouth daily. ?  ?aspirin 81 MG chewable tablet ?Chew 81 mg by mouth daily. ?  ?atorvastatin 40 MG tablet ?  Commonly known as: LIPITOR ?Take 40 mg by mouth at bedtime. ?  ?Baclofen 5 MG Tabs ?Take 5 mg by mouth every 8 (eight) hours as needed (headache/neck tension). ?  ?CALCIUM PO ?Take 1 tablet by mouth daily. ?  ?Cholecalciferol 125 MCG (5000 UT) capsule ?Take 5,000 Units by mouth daily. ?  ?FeroSul 325 (65 FE) MG tablet ?Generic drug: ferrous sulfate ?Take 325 mg by mouth daily. ?  ?lactulose 10 GM/15ML solution ?Commonly known as: CHRONULAC ?Take 10 g  by mouth 2 (two) times daily as needed for constipation. ?  ?latanoprost 0.005 % ophthalmic solution ?Commonly known as: XALATAN ?Place 1 drop into the left eye at bedtime. ?  ?multivitamin with minerals tablet ?Take 1 tablet by mouth daily. ?  ?PreserVision AREDS 2 Caps ?Take 2 capsules by mouth daily. ?  ?OMEGA-3 FATTY ACIDS PO ?Take 1 capsule by mouth daily. ?  ?pantoprazole 40 MG tablet ?Commonly known as: PROTONIX ?Take 40 mg by mouth daily. ?  ?PARoxetine 10 MG tablet ?Commonly known as: PAXIL ?Take 10 mg by mouth daily. ?  ?Rhopressa 0.02 % Soln ?Generic drug: Netarsudil Dimesylate ?Place 1 drop into the right eye in the morning and at bedtime. ?  ?Stool Softener 100 MG capsule ?Generic drug: docusate sodium ?Take 100 mg by mouth 2 (two) times daily. ?  ?sulfamethoxazole-trimethoprim 800-160 MG tablet ?Commonly known as: BACTRIM DS ?Take 1 tablet by mouth 2 (two) times daily for 7 days. ?  ? ?  ? ?  ?  ? ? ?  ?Discharge Care Instructions  ?(From admission, onward)  ?  ? ? ?  ? ?  Start     Ordered  ? 06/18/21 0000  Discharge wound care:       ?Comments: keep it clean and dry and it is okay to shower with soapy water and perform dressing changes daily  ? 06/18/21 1030  ? ?  ?  ? ?  ? ? Follow-up Information   ? ? Gomez Cleverly, MD. Schedule an appointment as soon as possible for a visit in 1 week(s).   ?Specialty: Orthopedic Surgery ?Contact information: ?[address] ?[place] [address] ?[phone] ? ? ?  ?  ? ?  ?  ? ?  ? ?Allergies  ?Allergen Reactions  ? Meclizine Swelling  ?  Causes angioedema ?Causes angioedema ?Causes angioedema ?  ? Bayer Aspirin [Aspirin] Hives  ?  Other brands are ok.  ? Lisinopril Swelling  ? Gabapentin Other (See Comments)  ?  Dizziness   ? ? ?Consultations: ?Orthopedics ? ? ?Procedures/Studies: ?CT HAND RIGHT WO CONTRAST ? ?Result Date: 06/15/2021 ?CLINICAL DATA:  Pain, infection, second digit swelling EXAM: CT OF THE RIGHT HAND WITHOUT CONTRAST TECHNIQUE:  Multidetector CT imaging of the right hand was performed according to the standard protocol. Multiplanar CT image reconstructions were also generated. RADIATION DOSE REDUCTION: This exam was performed according to the departmental dose-optimization program which includes automated exposure control, adjustment of the mA and/or kV according to patient size and/or use of iterative reconstruction technique. COMPARISON:  06/14/2021 FINDINGS: Bones/Joint/Cartilage No acute or destructive bony lesions. No periosteal reaction to suggest osteomyelitis. 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 Ligaments Suboptimally assessed by CT. Muscles and Tendons No gross abnormalities. Soft tissues There is mild diffuse subcutaneous edema most pronounced within the dorsum of the hand and within the thenar eminence. Mild subcutaneous fat stranding persists within the right second digit, though marked decrease in soft  tissue swelling since the x-ray performed yesterday. No evidence of fluid collection or abscess on this unenhanced exam. No subcutaneous gas or radiopaque foreign body. Reconstructed images demonstrate no additional findings. IMPRESSION: 1. Subcutaneous edema throughout the dorsum of the right hand, thenar eminence, and second digit compatible with cellulitis. Marked decreased soft tissue swelling within the radial aspect of the hand and second digit since prior x-ray. 2. No evidence of fluid collection or abscess on this unenhanced exam. 3. Mild osteoarthritis.  No acute or destructive bony lesions. Electronically Signed   By: Sharlet Salina M.D.   On: 06/15/2021 21:28  ? ?DG Hand Complete Right ? ?Result Date: 06/14/2021 ?CLINICAL DATA:  Swelling of the second digit. EXAM: RIGHT HAND - COMPLETE 3+ VIEW COMPARISON:  None. FINDINGS: No acute fracture or dislocation. No aggressive osseous lesion. Normal alignment. Mild osteoarthritis of first CMC joint. Severe soft  tissue swelling of the second digit concerning for cellulitis. No radiopaque foreign body or soft tissue emphysema. IMPRESSION: 1. Severe soft tissue swelling of the second digit concerning for cellulitis. Electronically Signed

## 2021-06-18 NOTE — Progress Notes (Signed)
Mobility Specialist Progress Note: ? ? 06/18/21 1144  ?Mobility  ?Activity Ambulated with assistance in hallway  ?Level of Assistance Standby assist, set-up cues, supervision of patient - no hands on  ?Assistive Device None  ?Distance Ambulated (ft) 400 ft  ?Activity Response Tolerated fair  ?$Mobility charge 1 Mobility  ? ?Pt received trying to leave. Redirected pt back to room. Pt began getting agitated stating she's going home and needs to have her IV taken out. Pt left in bed with call bell in reach, all needs met and bed alarm activated.  ? ?Angela Mueller ?Mobility Specialist ?Primary Phone [phone] ? ?

## 2021-06-18 NOTE — Plan of Care (Signed)
  Problem: Nutrition: Goal: Adequate nutrition will be maintained Outcome: Progressing   Problem: Pain Managment: Goal: General experience of comfort will improve Outcome: Progressing   Problem: Safety: Goal: Ability to remain free from injury will improve Outcome: Progressing   

## 2022-02-04 IMAGING — MR MR HEAD W/O CM
6 series · 48 of 48 positions shown · non-contrast
Comparison: MRI 07/24/2020, correlation is also made with
03/13/2021 CTA head neck.

CLINICAL DATA: Stroke, follow-up, sudden onset dizziness and
slurred speech

EXAM:
MRI HEAD WITHOUT CONTRAST
TECHNIQUE: Multiplanar, multiecho pulse sequences of the brain and surrounding
structures were obtained without intravenous contrast.

[Series 3: DWI · axial · 3.0mm · 1.09mm/px · z∈[-59,+82]mm · 16 of 96 slices shown (1 of 4)]
[im 1/96]
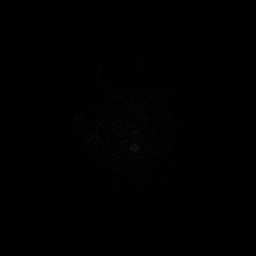
[im 7/96]
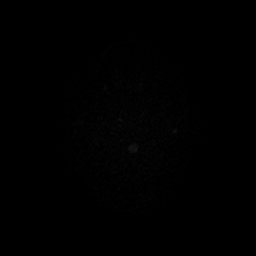
[im 13/96]
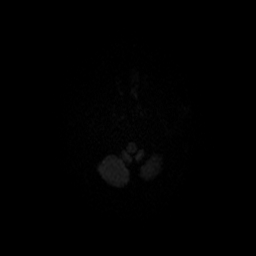
[im 20/96]
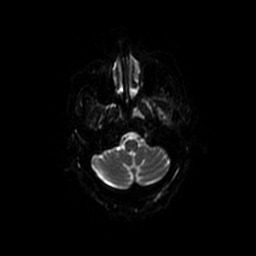
[im 26/96]
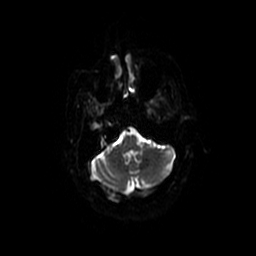
[im 32/96]
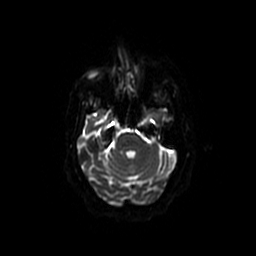
[im 39/96]
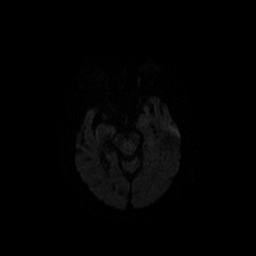
[im 45/96]
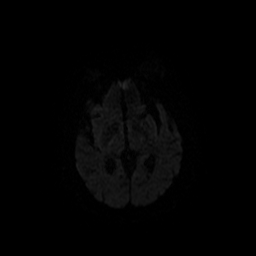
[im 51/96]
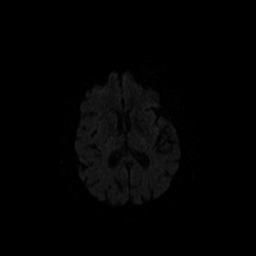
[im 58/96]
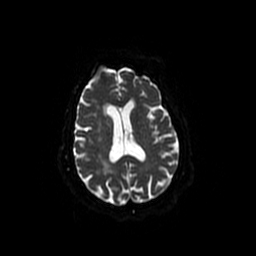
[im 64/96]
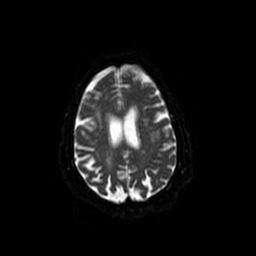
[im 70/96]
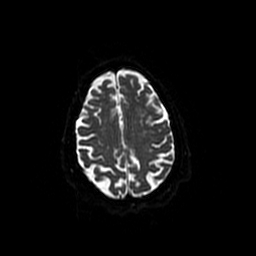
[im 77/96]
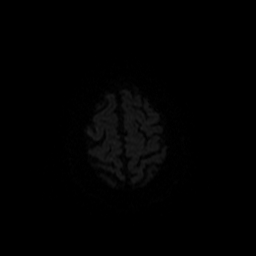
[im 83/96]
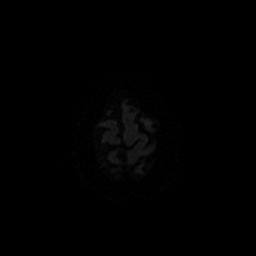
[im 89/96]
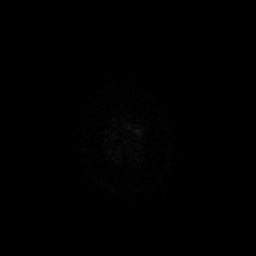
[im 96/96]
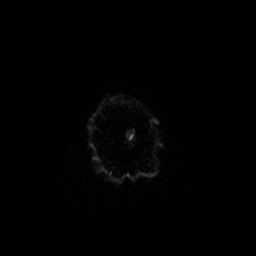

[Series 4: DWI · coronal · 5.0mm · 1.09mm/px · 11 of 68 slices shown (2 of 4)]
[im 1/68]
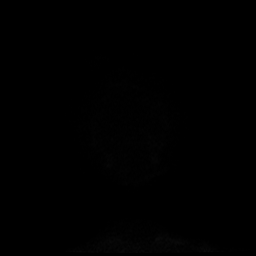
[im 7/68]
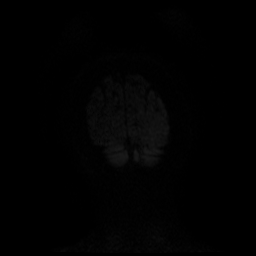
[im 14/68]
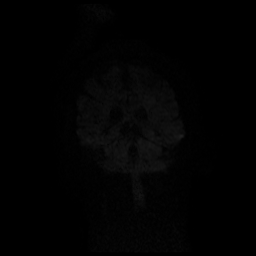
[im 21/68]
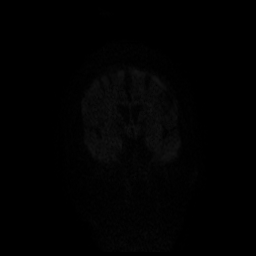
[im 27/68]
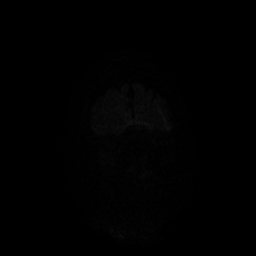
[im 34/68]
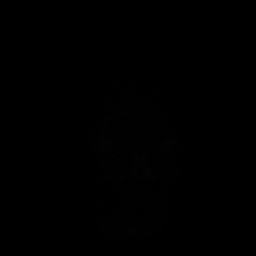
[im 41/68]
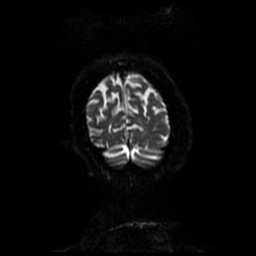
[im 47/68]
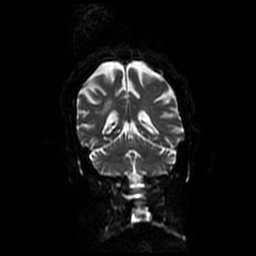
[im 54/68]
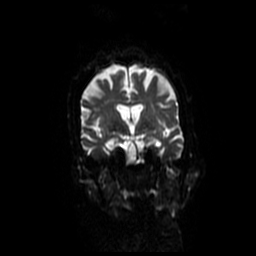
[im 61/68]
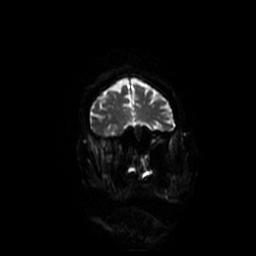
[im 68/68]
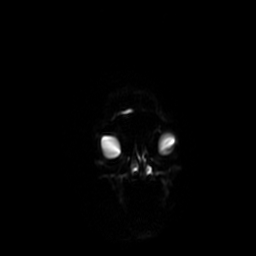

[Series 5: FLAIR · axial · 3.0mm · 0.43mm/px · z∈[-61,+83]mm · 4 of 25 slices shown]
[im 1/25]
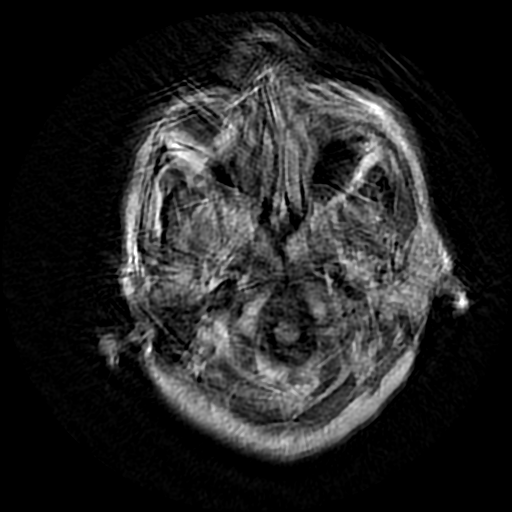
[im 9/25]
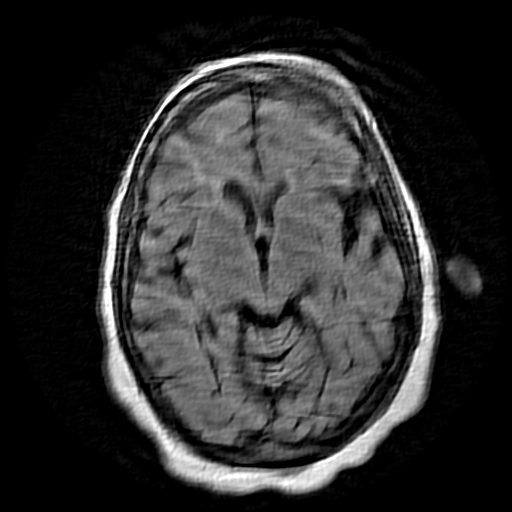
[im 17/25]
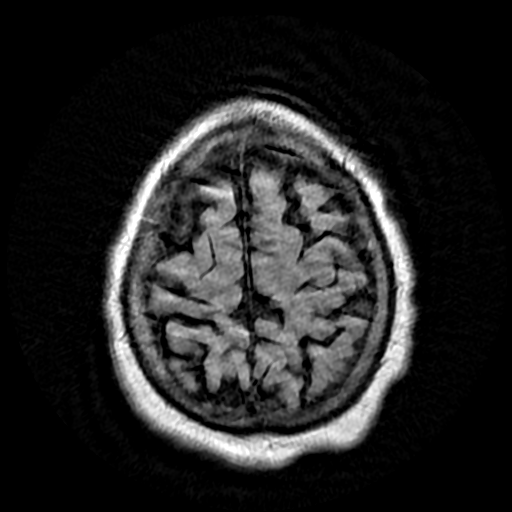
[im 25/25]
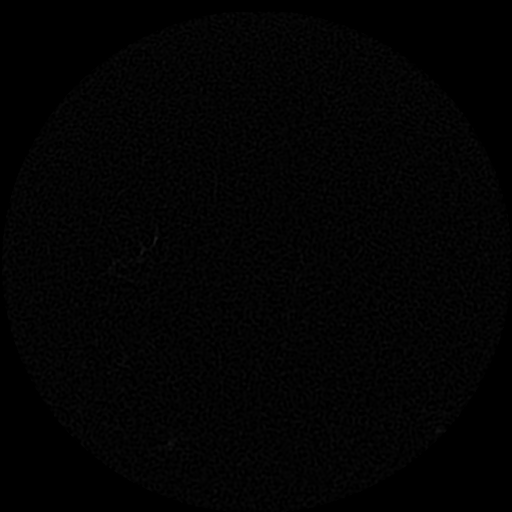

[Series 6: ax mpgr · axial · 5.0mm · 0.43mm/px · z∈[-59,+81]mm · 3 of 21 slices shown]
[im 1/21]
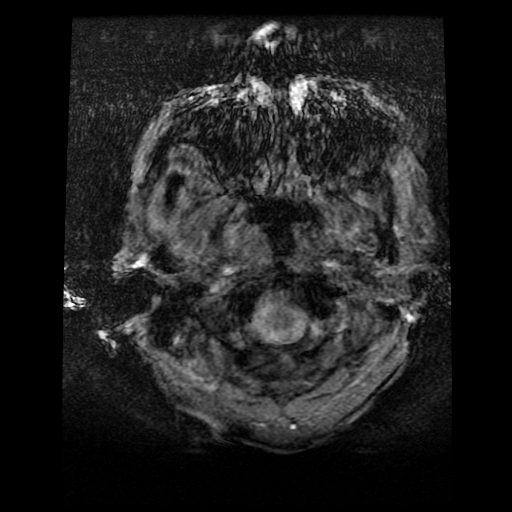
[im 11/21]
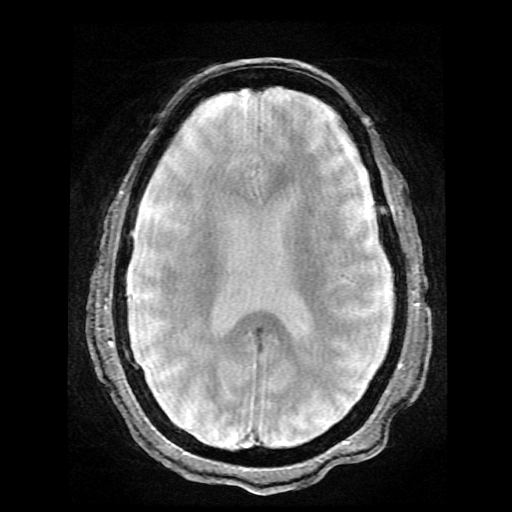
[im 21/21]
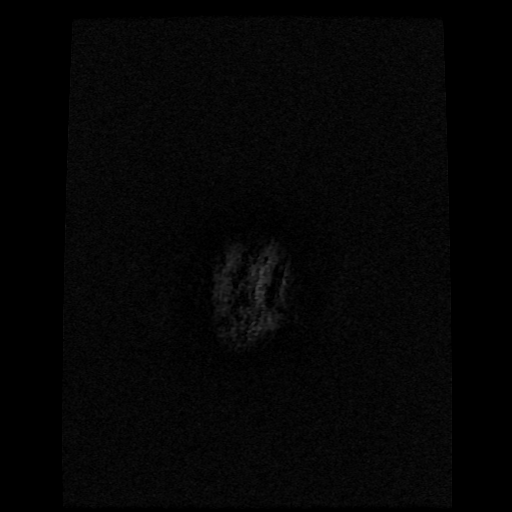

[Series 300: DWI · axial · 3.0mm · 1.09mm/px · z∈[-59,+82]mm · 8 of 48 slices shown (3 of 4)]
[im 1/48]
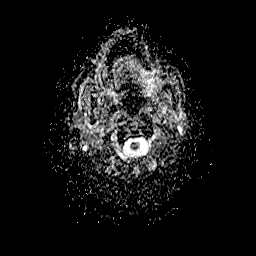
[im 7/48]
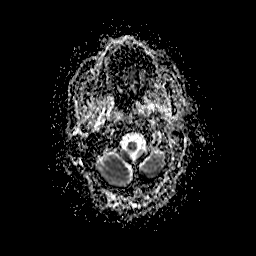
[im 14/48]
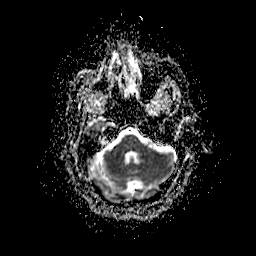
[im 21/48]
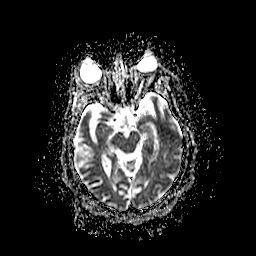
[im 27/48]
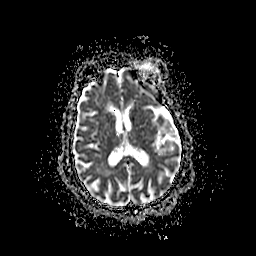
[im 34/48]
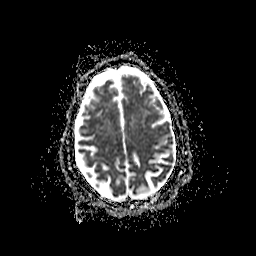
[im 41/48]
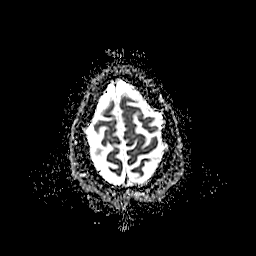
[im 48/48]
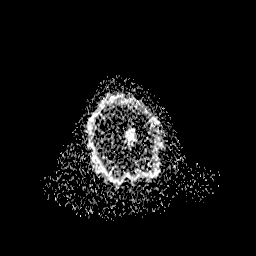

[Series 400: DWI · coronal · 5.0mm · 1.09mm/px · 6 of 34 slices shown (4 of 4)]
[im 1/34]
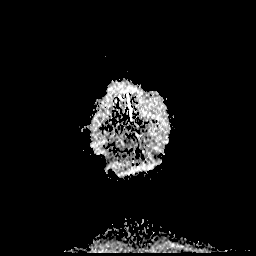
[im 7/34]
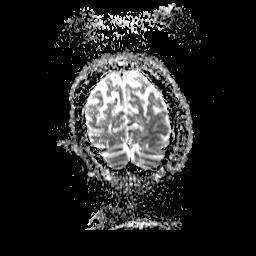
[im 14/34]
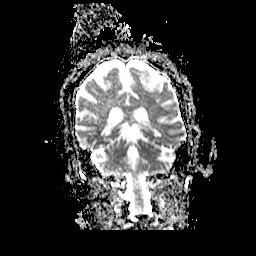
[im 20/34]
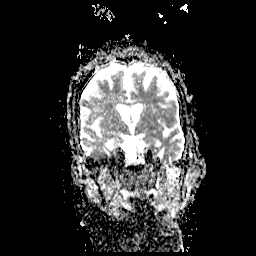
[im 27/34]
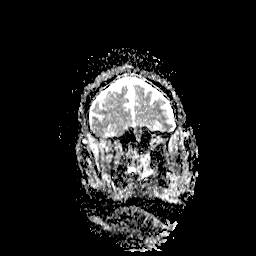
[im 34/34]
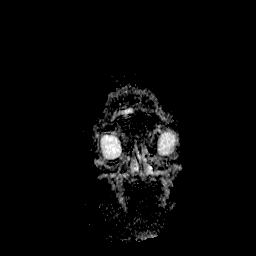

[48 of 48 positions shown; findings below may reference images not displayed]

FINDINGS: Evaluation is limited by motion and patient inability to cooperate
and complete exam. Only diffusion-weighted, FLAIR, and
susceptibility weighted sequences were obtained.

No restricted diffusion to suggest acute or subacute infarct. No
foci of hemosiderin deposition to suggest remote hemorrhage.
Mineralization in the basal ganglia. Moderate T2 hyperintense signal
in the periventricular white matter, likely the sequela of chronic
small vessel ischemic disease.
IMPRESSION: Limited evaluation, secondary to motion artifact and patient
inability to complete the exam. Limited sequences obtained
demonstrate no acute or subacute infarct.
